# Patient Record
Sex: Female | Born: 1955 | Race: Black or African American | Hispanic: No | Smoking: Former smoker
Health system: Southern US, Community
[De-identification: ages and names within clinical notes are randomized; demographics above are authoritative.]

## PROBLEM LIST (undated history)

## (undated) DIAGNOSIS — E7800 Pure hypercholesterolemia, unspecified: Secondary | ICD-10-CM

---

## 2000-07-28 ENCOUNTER — Encounter: Payer: Self-pay | Admitting: Family Medicine

## 2000-07-28 ENCOUNTER — Encounter: Admission: RE | Admit: 2000-07-28 | Discharge: 2000-07-28 | Payer: Self-pay | Admitting: Family Medicine

## 2002-10-20 ENCOUNTER — Encounter (INDEPENDENT_AMBULATORY_CARE_PROVIDER_SITE_OTHER): Payer: Self-pay | Admitting: *Deleted

## 2002-10-20 ENCOUNTER — Ambulatory Visit (HOSPITAL_COMMUNITY): Admission: RE | Admit: 2002-10-20 | Discharge: 2002-10-20 | Payer: Self-pay | Admitting: *Deleted

## 2004-12-17 ENCOUNTER — Other Ambulatory Visit: Admission: RE | Admit: 2004-12-17 | Discharge: 2004-12-17 | Payer: Self-pay | Admitting: Family Medicine

## 2005-01-07 ENCOUNTER — Encounter: Admission: RE | Admit: 2005-01-07 | Discharge: 2005-01-07 | Payer: Self-pay | Admitting: Family Medicine

## 2008-10-12 ENCOUNTER — Other Ambulatory Visit: Admission: RE | Admit: 2008-10-12 | Discharge: 2008-10-12 | Payer: Self-pay | Admitting: Family Medicine

## 2008-10-19 ENCOUNTER — Encounter: Admission: RE | Admit: 2008-10-19 | Discharge: 2008-10-19 | Payer: Self-pay | Admitting: Family Medicine

## 2009-10-14 ENCOUNTER — Emergency Department (HOSPITAL_COMMUNITY): Admission: EM | Admit: 2009-10-14 | Discharge: 2009-10-14 | Payer: Self-pay | Admitting: Emergency Medicine

## 2010-07-24 ENCOUNTER — Other Ambulatory Visit: Admission: RE | Admit: 2010-07-24 | Discharge: 2010-07-24 | Payer: Self-pay | Admitting: Family Medicine

## 2011-01-12 ENCOUNTER — Encounter: Payer: Self-pay | Admitting: Family Medicine

## 2011-10-29 ENCOUNTER — Other Ambulatory Visit: Payer: Self-pay | Admitting: Family Medicine

## 2011-10-29 DIAGNOSIS — Z1231 Encounter for screening mammogram for malignant neoplasm of breast: Secondary | ICD-10-CM

## 2011-12-03 ENCOUNTER — Ambulatory Visit
Admission: RE | Admit: 2011-12-03 | Discharge: 2011-12-03 | Disposition: A | Discharge status: Home / Self Care | Payer: BC Managed Care – PPO | Source: Ambulatory Visit | Attending: Family Medicine | Admitting: Family Medicine

## 2011-12-03 DIAGNOSIS — Z1231 Encounter for screening mammogram for malignant neoplasm of breast: Secondary | ICD-10-CM

## 2012-12-06 ENCOUNTER — Other Ambulatory Visit: Payer: Self-pay | Admitting: Family Medicine

## 2012-12-06 DIAGNOSIS — Z1231 Encounter for screening mammogram for malignant neoplasm of breast: Secondary | ICD-10-CM

## 2013-01-05 ENCOUNTER — Ambulatory Visit
Admission: RE | Admit: 2013-01-05 | Discharge: 2013-01-05 | Disposition: A | Discharge status: Home / Self Care | Payer: Managed Care, Other (non HMO) | Source: Ambulatory Visit | Attending: Family Medicine | Admitting: Family Medicine

## 2013-01-05 DIAGNOSIS — Z1231 Encounter for screening mammogram for malignant neoplasm of breast: Secondary | ICD-10-CM

## 2013-04-07 ENCOUNTER — Other Ambulatory Visit: Payer: Self-pay | Admitting: Family Medicine

## 2013-04-07 DIAGNOSIS — R1013 Epigastric pain: Secondary | ICD-10-CM

## 2013-04-12 ENCOUNTER — Ambulatory Visit
Admission: RE | Admit: 2013-04-12 | Discharge: 2013-04-12 | Disposition: A | Discharge status: Home / Self Care | Payer: Managed Care, Other (non HMO) | Source: Ambulatory Visit | Attending: Family Medicine | Admitting: Family Medicine

## 2013-04-12 DIAGNOSIS — R1013 Epigastric pain: Secondary | ICD-10-CM

## 2013-12-08 ENCOUNTER — Other Ambulatory Visit (HOSPITAL_COMMUNITY)
Admission: RE | Admit: 2013-12-08 | Discharge: 2013-12-08 | Disposition: A | Discharge status: Home / Self Care | Payer: Managed Care, Other (non HMO) | Source: Ambulatory Visit | Attending: Family Medicine | Admitting: Family Medicine

## 2013-12-08 ENCOUNTER — Other Ambulatory Visit: Payer: Self-pay | Admitting: Family Medicine

## 2013-12-08 DIAGNOSIS — Z124 Encounter for screening for malignant neoplasm of cervix: Secondary | ICD-10-CM | POA: Insufficient documentation

## 2014-01-23 ENCOUNTER — Emergency Department (HOSPITAL_COMMUNITY)
Admission: EM | Admit: 2014-01-23 | Discharge: 2014-01-23 | Disposition: A | Discharge status: Home / Self Care | Payer: BC Managed Care – PPO | Attending: Emergency Medicine | Admitting: Emergency Medicine

## 2014-01-23 ENCOUNTER — Emergency Department (HOSPITAL_COMMUNITY): Payer: BC Managed Care – PPO

## 2014-01-23 ENCOUNTER — Encounter (HOSPITAL_COMMUNITY): Payer: Self-pay | Admitting: Emergency Medicine

## 2014-01-23 DIAGNOSIS — F17200 Nicotine dependence, unspecified, uncomplicated: Secondary | ICD-10-CM | POA: Insufficient documentation

## 2014-01-23 DIAGNOSIS — Z79899 Other long term (current) drug therapy: Secondary | ICD-10-CM | POA: Insufficient documentation

## 2014-01-23 DIAGNOSIS — R112 Nausea with vomiting, unspecified: Secondary | ICD-10-CM

## 2014-01-23 DIAGNOSIS — R1011 Right upper quadrant pain: Secondary | ICD-10-CM

## 2014-01-23 DIAGNOSIS — Z791 Long term (current) use of non-steroidal anti-inflammatories (NSAID): Secondary | ICD-10-CM | POA: Insufficient documentation

## 2014-01-23 DIAGNOSIS — E7800 Pure hypercholesterolemia, unspecified: Secondary | ICD-10-CM | POA: Insufficient documentation

## 2014-01-23 DIAGNOSIS — Z3202 Encounter for pregnancy test, result negative: Secondary | ICD-10-CM | POA: Insufficient documentation

## 2014-01-23 DIAGNOSIS — R52 Pain, unspecified: Secondary | ICD-10-CM | POA: Insufficient documentation

## 2014-01-23 HISTORY — DX: Pure hypercholesterolemia, unspecified: E78.00

## 2014-01-23 LAB — CBC WITH DIFFERENTIAL/PLATELET
BASOS ABS: 0.1 10*3/uL (ref 0.0–0.1)
Basophils Relative: 2 % — ABNORMAL HIGH (ref 0–1)
Eosinophils Absolute: 0.2 10*3/uL (ref 0.0–0.7)
Eosinophils Relative: 5 % (ref 0–5)
HEMATOCRIT: 36.1 % (ref 36.0–46.0)
HEMOGLOBIN: 12.8 g/dL (ref 12.0–15.0)
LYMPHS PCT: 38 % (ref 12–46)
Lymphs Abs: 1.8 10*3/uL (ref 0.7–4.0)
MCH: 33.5 pg (ref 26.0–34.0)
MCHC: 35.5 g/dL (ref 30.0–36.0)
MCV: 94.5 fL (ref 78.0–100.0)
MONO ABS: 0.4 10*3/uL (ref 0.1–1.0)
MONOS PCT: 8 % (ref 3–12)
NEUTROS ABS: 2.2 10*3/uL (ref 1.7–7.7)
NEUTROS PCT: 48 % (ref 43–77)
Platelets: 185 10*3/uL (ref 150–400)
RBC: 3.82 MIL/uL — ABNORMAL LOW (ref 3.87–5.11)
RDW: 12.1 % (ref 11.5–15.5)
WBC: 4.6 10*3/uL (ref 4.0–10.5)

## 2014-01-23 LAB — POCT PREGNANCY, URINE: Preg Test, Ur: NEGATIVE

## 2014-01-23 LAB — URINALYSIS, ROUTINE W REFLEX MICROSCOPIC
GLUCOSE, UA: NEGATIVE mg/dL
Hgb urine dipstick: NEGATIVE
KETONES UR: NEGATIVE mg/dL
Nitrite: NEGATIVE
PH: 5.5 (ref 5.0–8.0)
PROTEIN: NEGATIVE mg/dL
Specific Gravity, Urine: 1.02 (ref 1.005–1.030)
Urobilinogen, UA: 0.2 mg/dL (ref 0.0–1.0)

## 2014-01-23 LAB — COMPREHENSIVE METABOLIC PANEL
ALBUMIN: 3.9 g/dL (ref 3.5–5.2)
ALK PHOS: 63 U/L (ref 39–117)
ALT: 39 U/L — ABNORMAL HIGH (ref 0–35)
AST: 40 U/L — AB (ref 0–37)
BILIRUBIN TOTAL: 1 mg/dL (ref 0.3–1.2)
BUN: 6 mg/dL (ref 6–23)
CHLORIDE: 102 meq/L (ref 96–112)
CO2: 24 meq/L (ref 19–32)
CREATININE: 0.6 mg/dL (ref 0.50–1.10)
Calcium: 9.2 mg/dL (ref 8.4–10.5)
GFR calc Af Amer: >90 mL/min (ref >90)
Glucose, Bld: 108 mg/dL — ABNORMAL HIGH (ref 70–99)
POTASSIUM: 3.6 meq/L — AB (ref 3.7–5.3)
Sodium: 141 mEq/L (ref 137–147)
Total Protein: 7.1 g/dL (ref 6.0–8.3)

## 2014-01-23 LAB — URINE MICROSCOPIC-ADD ON

## 2014-01-23 LAB — LIPASE, BLOOD: Lipase: 130 U/L — ABNORMAL HIGH (ref 11–59)

## 2014-01-23 MED ORDER — SODIUM CHLORIDE 0.9 % IV BOLUS (SEPSIS)
1000.0000 mL | Freq: Once | INTRAVENOUS | Status: AC
  Administered 2014-01-23: 1000 mL via INTRAVENOUS

## 2014-01-23 MED ORDER — MORPHINE SULFATE 4 MG/ML IJ SOLN: 4.0000 mg | Freq: Once | INTRAMUSCULAR | Status: DC

## 2014-01-23 MED ORDER — MORPHINE SULFATE 4 MG/ML IJ SOLN
6.0000 mg | Freq: Once | INTRAMUSCULAR | Status: AC
  Administered 2014-01-23: 6 mg via INTRAVENOUS
  Filled 2014-01-23: qty 2

## 2014-01-23 NOTE — Discharge Instructions (Signed)
Take pain medication for any breakthrough pain. Take Phenergan for nausea. Avoid fatty foods. Low fat diet attached. Follow up with primary care doctor in 2-3 days for reevaluation. Recommend further evaluation of gallbladder with HIDA scan. Return to ED should your symptoms worsen, you develop intractable nausea/vomiting/diarrhea, you abdominal pain worsens, or you develop fever/chills.

## 2014-01-23 NOTE — Progress Notes (Signed)
   CARE MANAGEMENT ED NOTE 01/23/2014  Patient:  Mackenzie Dudley,Mackenzie Dudley   Account Number:  [id number]  Date Initiated:  01/23/2014  Documentation initiated by:  Edd ArbourGIBBS,Ramina Hulet  Subjective/Objective Assessment:   58 yr old [facility] [place]cp listed Pt confirms pcp as [employer]  c/o abdominal pain, flank pain     Subjective/Objective Assessment Detail:     Action/Plan:   EPIC updated   Action/Plan Detail:   Anticipated DC Date:       Status Recommendation to Physician:   Result of Recommendation:    Other ED Services  Consult Working Plan    DC Planning Services  Outpatient Services - Pt will follow up  Other  PCP issues    Choice offered to / List presented to:            Status of service:  Completed, signed off  ED Comments:   ED Comments Detail:

## 2014-01-23 NOTE — ED Notes (Signed)
Pt reports abdominal and right sided flank pain 10/10 x2 week. Reports vomited 2 days ago, nausea and diarrhea yesterday. At present denies nausea. Denies dysuria.

## 2014-01-23 NOTE — ED Notes (Signed)
US at bedside

## 2014-01-23 NOTE — ED Provider Notes (Signed)
CSN: [id number]     Arrival date & time 01/23/14  [id number] History   First MD Initiated Contact with Patient 01/23/14 [phone]     Chief Complaint  Patient presents with  . Abdominal Pain  . Flank Pain   (Consider location/radiation/quality/duration/timing/severity/associated sxs/prior Treatment) Patient is a 58 y.o. female presenting with abdominal pain and flank pain.  Abdominal Pain Associated symptoms: no hematuria   Flank Pain Associated symptoms include abdominal pain.   58 yo female presents with right sided abdominal pain and flank pain that started about 2 weeks ago. Patient states pain is 10/10 and described as "it's painful". Patient admits to intermittent N/V/D. Patient states symptoms seem to be worsened after eating. Patient denies any urinary symptoms. Denies fever/chills. Patient is a 20+ pack year smoker. PMH significant for high cholesterol. Family hx significant for colon cancer. Patient gets regular screening colonoscopies, though is due for one this year.  Past Medical History  Diagnosis Date  . High cholesterol    History reviewed. No pertinent past surgical history. History reviewed. No pertinent family history. History  Substance Use Topics  . Smoking status: Light Tobacco Smoker    Types: Cigarettes  . Smokeless tobacco: Not on file  . Alcohol Use: Yes     Comment: 1 beer/ day   OB History   Grav Para Term Preterm Abortions TAB SAB Ect Mult Living                 Review of Systems  Gastrointestinal: Positive for abdominal pain. Negative for blood in stool.  Genitourinary: Positive for flank pain. Negative for hematuria.  All other systems reviewed and are negative.    Allergies  Review of patient's allergies indicates no known allergies.  Home Medications   Current Outpatient Rx  Name  Route  Sig  Dispense  Refill  . acetaminophen (TYLENOL) 500 MG tablet   Oral   Take 500 mg by mouth every 6 (six) hours as needed.         . bisacodyl (DULCOLAX)  5 MG EC tablet   Oral   Take 5 mg by mouth daily as needed for moderate constipation.         Marland Kitchen. HYDROcodone-acetaminophen (NORCO/VICODIN) 5-325 MG per tablet   Oral   Take 1 tablet by mouth every 4 (four) hours as needed.         . naproxen sodium (ANAPROX) 220 MG tablet   Oral   Take 220 mg by mouth 2 (two) times daily with a meal.         . pravastatin (PRAVACHOL) 40 MG tablet   Oral   Take 1 tablet by mouth daily.         . promethazine (PHENERGAN) 25 MG tablet   Oral   Take 25 mg by mouth every 6 (six) hours as needed for nausea or vomiting.         . simethicone (MYLICON) 80 MG chewable tablet   Oral   Chew 80 mg by mouth every 6 (six) hours as needed for flatulence.         . tamsulosin (FLOMAX) 0.4 MG CAPS capsule   Oral   Take 1 capsule by mouth daily.         . traMADol (ULTRAM) 50 MG tablet   Oral   Take 50 mg by mouth every 6 (six) hours as needed.         . traZODone (DESYREL) 50 MG tablet   Oral  Take 50-150 mg by mouth at bedtime.          BP 175/96  Pulse 80  Temp(Src) 97.5 F (36.4 C) (Oral)  Resp 16  Ht 5\' 1"  (1.549 m)  Wt 164 lb (74.39 kg)  BMI 31.00 kg/m2  SpO2 100% Physical Exam  Nursing note and vitals reviewed. Constitutional: She is oriented to person, place, and time. She appears well-developed and well-nourished. No distress.  HENT:  Head: Normocephalic and atraumatic.  Eyes: Conjunctivae and EOM are normal. No scleral icterus.  Neck: Normal range of motion. Neck supple. No JVD present.  Cardiovascular: Normal rate and regular rhythm.  Exam reveals no gallop and no friction rub.   No murmur heard. Pulmonary/Chest: Effort normal and breath sounds normal. No respiratory distress. She has no wheezes. She has no rales.  Abdominal: Soft. Bowel sounds are normal. There is no hepatosplenomegaly. There is tenderness in the right upper quadrant. There is no rigidity, no rebound, no guarding, no tenderness at McBurney's point  and negative Murphy's sign.  Musculoskeletal: Normal range of motion. She exhibits no edema.  Neurological: She is alert and oriented to person, place, and time.  Skin: Skin is warm and dry. She is not diaphoretic.  Psychiatric: She has a normal mood and affect. Her behavior is normal.    ED Course  Procedures (including critical care time) Labs Review Labs Reviewed  CBC WITH DIFFERENTIAL - Abnormal; Notable for the following:    RBC 3.82 (*)    Basophils Relative 2 (*)    All other components within normal limits  COMPREHENSIVE METABOLIC PANEL - Abnormal; Notable for the following:    Potassium 3.6 (*)    Glucose, Bld 108 (*)    AST 40 (*)    ALT 39 (*)    All other components within normal limits  LIPASE, BLOOD - Abnormal; Notable for the following:    Lipase 130 (*)    All other components within normal limits  URINALYSIS, ROUTINE W REFLEX MICROSCOPIC - Abnormal; Notable for the following:    Color, Urine AMBER (*)    APPearance CLOUDY (*)    Bilirubin Urine SMALL (*)    Leukocytes, UA SMALL (*)    All other components within normal limits  URINE MICROSCOPIC-ADD ON - Abnormal; Notable for the following:    Bacteria, UA FEW (*)    All other components within normal limits  POCT PREGNANCY, URINE   Imaging Review US Abdomen Complete  01/23/2014   CLINICAL DATA:  Right upper quadrant abdominal pain.  EXAM: ULTRASOUND ABDOMEN COMPLETE  COMPARISON:  US ABDOMEN COMPLETE dated 04/12/2013  FINDINGS: Gallbladder:  No gallstones or wall thickening visualized. No sonographic Murphy sign noted.  Common bile duct:  Diameter: 7.5 mm. This appears slightly more dilated than previously, although tapers distally. There is no evidence of choledocholithiasis.  Liver:  No focal lesion identified. Within normal limits in parenchymal echogenicity.  IVC:  No abnormality visualized.  Pancreas:  No abnormality identified. Portions of the pancreatic tail are obscured by bowel gas.  Spleen:  Size and  appearance within normal limits.  Right Kidney:  Length: 11.1 cm. Echogenicity within normal limits. No mass or hydronephrosis visualized.  Left Kidney:  Length: 10.3 cm. Echogenicity within normal limits. No mass or hydronephrosis visualized.  Abdominal aorta:  No aneurysm visualized.  Other findings:  None.  IMPRESSION: 1. New mild fusiform dilatation of the common bile duct. No pancreatic mass or choledocholithiasis identified. Correlation with liver function studies  recommended. 2. Otherwise unremarkable study.  The gallbladder appears normal.   Electronically Signed   By: Roxy Horseman M.D.   On: 01/23/2014 12:35    EKG Interpretation   None       MDM   1. Abdominal pain, acute, right upper quadrant   2. N&V (nausea and vomiting)    Patient afebrile.  BP elevated, patient currently asymptomatic. Recommend follow up with PCP in 2 days for recheck.  Urine preg negative UA shows small bilirubin, leukocytes and few bacteria.  Mild transaminitis with mildy elevated Lipase at 130 Abdominal US shows New mild dilatation of common bile duct. No pancreatic mass or choledocholithiasis. Otherwise unremarkable study.   Plan to treat patient's symptoms and have patient follow up with PCP in 2 days for further evaluation. Recommend HIDA scan on outpatient basis. Patient currently has plenty of nausea and pain medication from previous doctor visit for same symptoms. Patient given return precautions if sxs should worsen. Recommend avoiding fatty foods. Low fat diet handout provided. Patient agrees with plan. Discharged in good condition.  Meds given in ED:  Medications  sodium chloride 0.9 % bolus 1,000 mL (0 mLs Intravenous Stopped 01/23/14 1218)  morphine 4 MG/ML injection 6 mg (6 mg Intravenous Given 01/23/14 1122)    Discharge Medication List as of 01/23/2014  1:21 PM          Rudene Anda, PA-C 01/24/14 2221

## 2014-01-26 NOTE — ED Provider Notes (Signed)
Medical screening examination/treatment/procedure(s) were performed by non-physician practitioner and as supervising physician I was immediately available for consultation/collaboration.  EKG Interpretation   None        Flint MelterElliott L Selisa Tensley, MD 01/26/14 1141

## 2015-06-06 ENCOUNTER — Other Ambulatory Visit: Payer: Self-pay

## 2015-06-06 DIAGNOSIS — Z1231 Encounter for screening mammogram for malignant neoplasm of breast: Secondary | ICD-10-CM

## 2015-06-11 ENCOUNTER — Ambulatory Visit
Admission: RE | Admit: 2015-06-11 | Discharge: 2015-06-11 | Disposition: A | Discharge status: Home / Self Care | Payer: BLUE CROSS/BLUE SHIELD | Source: Ambulatory Visit

## 2015-06-11 DIAGNOSIS — Z1231 Encounter for screening mammogram for malignant neoplasm of breast: Secondary | ICD-10-CM

## 2016-08-27 ENCOUNTER — Other Ambulatory Visit: Payer: Self-pay | Admitting: Family Medicine

## 2016-08-27 ENCOUNTER — Other Ambulatory Visit (HOSPITAL_COMMUNITY)
Admission: RE | Admit: 2016-08-27 | Discharge: 2016-08-27 | Disposition: A | Discharge status: Home / Self Care | Payer: BLUE CROSS/BLUE SHIELD | Source: Ambulatory Visit | Attending: Family Medicine | Admitting: Family Medicine

## 2016-08-27 DIAGNOSIS — Z01419 Encounter for gynecological examination (general) (routine) without abnormal findings: Secondary | ICD-10-CM | POA: Diagnosis not present

## 2016-08-28 ENCOUNTER — Other Ambulatory Visit: Payer: Self-pay | Admitting: Family Medicine

## 2016-08-28 DIAGNOSIS — Z1231 Encounter for screening mammogram for malignant neoplasm of breast: Secondary | ICD-10-CM

## 2016-08-29 LAB — CYTOLOGY - PAP

## 2016-09-10 ENCOUNTER — Ambulatory Visit
Admission: RE | Admit: 2016-09-10 | Discharge: 2016-09-10 | Disposition: A | Discharge status: Home / Self Care | Payer: BLUE CROSS/BLUE SHIELD | Source: Ambulatory Visit | Attending: Family Medicine | Admitting: Family Medicine

## 2016-09-10 DIAGNOSIS — Z1231 Encounter for screening mammogram for malignant neoplasm of breast: Secondary | ICD-10-CM

## 2017-09-21 ENCOUNTER — Ambulatory Visit (INDEPENDENT_AMBULATORY_CARE_PROVIDER_SITE_OTHER): Payer: Self-pay | Admitting: Orthopaedic Surgery

## 2017-09-30 ENCOUNTER — Other Ambulatory Visit: Payer: Self-pay | Admitting: Family Medicine

## 2017-09-30 DIAGNOSIS — Z1231 Encounter for screening mammogram for malignant neoplasm of breast: Secondary | ICD-10-CM

## 2017-10-07 ENCOUNTER — Ambulatory Visit
Admission: RE | Admit: 2017-10-07 | Discharge: 2017-10-07 | Disposition: A | Discharge status: Home / Self Care | Payer: BLUE CROSS/BLUE SHIELD | Source: Ambulatory Visit | Attending: Family Medicine | Admitting: Family Medicine

## 2017-10-07 DIAGNOSIS — Z1231 Encounter for screening mammogram for malignant neoplasm of breast: Secondary | ICD-10-CM

## 2017-10-08 ENCOUNTER — Ambulatory Visit: Payer: BLUE CROSS/BLUE SHIELD

## 2018-09-22 ENCOUNTER — Encounter (INDEPENDENT_AMBULATORY_CARE_PROVIDER_SITE_OTHER): Payer: Self-pay | Admitting: Orthopaedic Surgery

## 2018-09-22 ENCOUNTER — Ambulatory Visit (INDEPENDENT_AMBULATORY_CARE_PROVIDER_SITE_OTHER): Payer: Self-pay

## 2018-09-22 ENCOUNTER — Ambulatory Visit (INDEPENDENT_AMBULATORY_CARE_PROVIDER_SITE_OTHER): Payer: BLUE CROSS/BLUE SHIELD | Admitting: Orthopaedic Surgery

## 2018-09-22 DIAGNOSIS — B070 Plantar wart: Secondary | ICD-10-CM

## 2018-09-22 DIAGNOSIS — M25511 Pain in right shoulder: Secondary | ICD-10-CM | POA: Diagnosis not present

## 2018-09-22 DIAGNOSIS — G8929 Other chronic pain: Secondary | ICD-10-CM

## 2018-09-22 NOTE — Progress Notes (Signed)
Office Visit Note   Patient: Mackenzie Dudley           Date of Birth: Jul 23, 1956           MRN: [id number] Visit Date: 09/22/2018              Requested by: Blair Heys, MD [address] [place], [place] [address] PCP: Blair Heys, MD   Assessment & Plan: Visit Diagnoses:  1. Chronic right shoulder pain   2. Plantar wart of left foot     Plan: We will have her work on range of motion of the right shoulder with forward flexion exercises pendulum exercises.  In regards to the plantars wart she is advised to get some Compound W apply after paring down the callus.  See her back in 2 weeks to check her response to the injection in the right shoulder.  If the wart on the bottom of her foot just not resolve with the Compound W recommend she see a dermatologist.  Questions encouraged and answered by Dr. Magnus Ivan myself.  Follow-Up Instructions: Return in about 2 weeks (around 10/06/2018).   Orders:  Orders Placed This Encounter  Procedures  . XR Shoulder Right   No orders of the defined types were placed in this encounter.     Procedures: No procedures performed   Clinical Data: No additional findings.   Subjective: Chief Complaint  Patient presents with  . Right Shoulder - Pain    HPI Mackenzie Dudley 62 year old female who we not seen in the past four years.  She comes in today with right shoulder pain that is been ongoing for the past 3 months.  No particular injury.  She has a remote history of a right shoulder dislocation 2005.  She has had no additional dislocations.  She was given a cortisone injection of the right shoulder in March 2015 did well with this until just recently.  States she is having constant pain in the shoulder.  States it feels as if there is pulling sensation in the shoulder goes down to the elbow.  She denies any neck pain she has occasional tingling down the right arm to the wrist but this is rare.  She is tried Aspercreme and ibuprofen  which helps with this short-lived.  Pain in the shoulder does awaken him.  She also has an area on her foot that is bothering her she is seeing a podiatrist for his pared this area down.  Area the callus continues to form despite her paring this down at home. Review of Systems Please see HPI otherwise noncontributory or negative.  Objective: Vital Signs: There were no vitals taken for this visit.  Physical Exam  Constitutional: She is oriented to person, place, and time. She appears well-developed and well-nourished. No distress.  Pulmonary/Chest: Effort normal.  Neurological: She is alert and oriented to person, place, and time.  Skin: She is not diaphoretic.  Psychiatric: She has a normal mood and affect.    Ortho Exam Left foot callus formation under the fourth metatarsal midshaft region with a plantar wart in the midportion of this.  No other rash skin lesions ulcerations in the foot.  Nontender over the metatarsal heads with palpation. Right shoulder: He has 5 out of 5 strength with external and internal rotation against resistance.  Empty can test 5 out of 5 strength bilaterally in the painful on the right.  Positive impingement on the right negative on the left.  Fluid motion  of the right shoulder without significant pain.  Liftoff test she has 5 out of 5 strength bilaterally but again painful on the right. Cervical spine full forward flexion extension negative Spurling's.  Tenderness right medial border of the scapula. Specialty Comments:  No specialty comments available.  Imaging: Xr Shoulder Right  Result Date: 09/22/2018 Right shoulder 3 views: No acute fracture.  Shoulders well located.  Osteophytes off the most lateral aspect of the acromium.  Subacromial space well maintained.  Glenohumeral joint is well-maintained    PMFS History: There are no active problems to display for this patient.  Past Medical History:  Diagnosis Date  . High cholesterol     History  reviewed. No pertinent family history.  History reviewed. No pertinent surgical history. Social History   Occupational History  . Not on file  Tobacco Use  . Smoking status: Light Tobacco Smoker    Types: Cigarettes  . Smokeless tobacco: Never Used  Substance and Sexual Activity  . Alcohol use: Yes    Comment: 1 beer/ day  . Drug use: No  . Sexual activity: Not on file

## 2018-10-06 ENCOUNTER — Ambulatory Visit (INDEPENDENT_AMBULATORY_CARE_PROVIDER_SITE_OTHER): Payer: BLUE CROSS/BLUE SHIELD | Admitting: Orthopaedic Surgery

## 2018-11-25 ENCOUNTER — Other Ambulatory Visit: Payer: Self-pay | Admitting: Family Medicine

## 2018-11-25 DIAGNOSIS — Z1231 Encounter for screening mammogram for malignant neoplasm of breast: Secondary | ICD-10-CM

## 2019-01-05 ENCOUNTER — Ambulatory Visit
Admission: RE | Admit: 2019-01-05 | Discharge: 2019-01-05 | Disposition: A | Discharge status: Home / Self Care | Payer: BLUE CROSS/BLUE SHIELD | Source: Ambulatory Visit | Attending: Family Medicine | Admitting: Family Medicine

## 2019-01-05 DIAGNOSIS — Z1231 Encounter for screening mammogram for malignant neoplasm of breast: Secondary | ICD-10-CM

## 2019-01-06 ENCOUNTER — Other Ambulatory Visit: Payer: Self-pay | Admitting: Family Medicine

## 2019-01-06 DIAGNOSIS — R928 Other abnormal and inconclusive findings on diagnostic imaging of breast: Secondary | ICD-10-CM

## 2019-01-10 ENCOUNTER — Ambulatory Visit
Admission: RE | Admit: 2019-01-10 | Discharge: 2019-01-10 | Disposition: A | Discharge status: Home / Self Care | Payer: BLUE CROSS/BLUE SHIELD | Source: Ambulatory Visit | Attending: Family Medicine | Admitting: Family Medicine

## 2019-01-10 DIAGNOSIS — R928 Other abnormal and inconclusive findings on diagnostic imaging of breast: Secondary | ICD-10-CM

## 2020-02-16 ENCOUNTER — Other Ambulatory Visit: Payer: Self-pay | Admitting: Obstetrics and Gynecology

## 2020-02-16 ENCOUNTER — Other Ambulatory Visit: Payer: Self-pay | Admitting: Family Medicine

## 2020-02-16 DIAGNOSIS — Z1231 Encounter for screening mammogram for malignant neoplasm of breast: Secondary | ICD-10-CM

## 2020-02-17 ENCOUNTER — Other Ambulatory Visit: Payer: Self-pay

## 2020-02-17 ENCOUNTER — Ambulatory Visit
Admission: RE | Admit: 2020-02-17 | Discharge: 2020-02-17 | Disposition: A | Discharge status: Home / Self Care | Payer: BC Managed Care – PPO | Source: Ambulatory Visit | Attending: Family Medicine | Admitting: Family Medicine

## 2020-02-17 DIAGNOSIS — Z1231 Encounter for screening mammogram for malignant neoplasm of breast: Secondary | ICD-10-CM

## 2020-09-19 ENCOUNTER — Encounter: Payer: Self-pay | Admitting: Orthopaedic Surgery

## 2020-09-19 ENCOUNTER — Ambulatory Visit (INDEPENDENT_AMBULATORY_CARE_PROVIDER_SITE_OTHER): Payer: 59 | Admitting: Orthopaedic Surgery

## 2020-09-19 DIAGNOSIS — G8929 Other chronic pain: Secondary | ICD-10-CM | POA: Diagnosis not present

## 2020-09-19 DIAGNOSIS — M25511 Pain in right shoulder: Secondary | ICD-10-CM | POA: Diagnosis not present

## 2020-09-19 MED ORDER — METHYLPREDNISOLONE ACETATE 40 MG/ML IJ SUSP
40.0000 mg | INTRAMUSCULAR | Status: AC | PRN
  Administered 2020-09-19: 40 mg via INTRA_ARTICULAR

## 2020-09-19 MED ORDER — NABUMETONE 500 MG PO TABS: 500.0000 mg | ORAL_TABLET | Freq: Two times a day (BID) | ORAL | 1 refills | Status: DC | PRN

## 2020-09-19 MED ORDER — LIDOCAINE HCL 1 % IJ SOLN
3.0000 mL | INTRAMUSCULAR | Status: AC | PRN
  Administered 2020-09-19: 3 mL

## 2020-09-19 MED ORDER — METHYLPREDNISOLONE 4 MG PO TABS: ORAL_TABLET | ORAL | 0 refills | Status: DC

## 2020-09-19 NOTE — Progress Notes (Signed)
Office Visit Note   Patient: Mackenzie Dudley           Date of Birth: 1956/09/23           MRN: [id number] Visit Date: 09/19/2020              Requested by: Blair Heys, MD [address] [place],  [place] [address] PCP: Blair Heys, MD   Assessment & Plan: Visit Diagnoses:  1. Chronic right shoulder pain     Plan: Certainly her muscle aches and pains and body cramps could be related to something metabolic.  She does state that her primary care physician drew a bunch of labs on her but did not report anything out of the ordinary.  She is not a diabetic.  I am not sure what her labs show and she may get those sent to my office.  I want her started on Relafen as an anti-inflammatory and try a steroid taper as well as provide a steroid injection right shoulder today since she is getting ready to start her job.  She said that every time she is seen me she does feel better afterwards.  She agrees with this treatment plan.  She did tolerate the steroid injection well.  I would like to see her back in 4 weeks to make sure she is doing well.  All question concerns were answered and addressed  Follow-Up Instructions: Return in about 4 weeks (around 10/17/2020).   Orders:  Orders Placed This Encounter  Procedures  . Large Joint Inj   Meds ordered this encounter  Medications  . methylPREDNISolone (MEDROL) 4 MG tablet    Sig: Medrol dose pack. Take as instructed    Dispense:  21 tablet    Refill:  0  . nabumetone (RELAFEN) 500 MG tablet    Sig: Take 1 tablet (500 mg total) by mouth 2 (two) times daily as needed.    Dispense:  60 tablet    Refill:  1      Procedures: Large Joint Inj: R subacromial bursa on 09/19/2020 11:09 AM Indications: pain and diagnostic evaluation Details: 22 G 1.5 in needle  Arthrogram: No  Medications: 3 mL lidocaine 1 %; 40 mg methylPREDNISolone acetate 40 MG/ML Outcome: tolerated well, no immediate complications Procedure, treatment  alternatives, risks and benefits explained, specific risks discussed. Consent was given by the patient. Immediately prior to procedure a time out was called to verify the correct patient, procedure, equipment, support staff and site/side marked as required. Patient was prepped and draped in the usual sterile fashion.       Clinical Data: No additional findings.   Subjective: Chief Complaint  Patient presents with  . 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  She comes in with right arm pain but also just all over body cramps and aches which she states occurs all the time.  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  She says that is not involved with her activities.  She still been dealing with some right shoulder pain and was seen her for her shoulder before finding significant tendinitis in the shoulder.  She is now diabetic.  Currently she is not taking any anti-inflammatories.  She is on a cholesterol medication but she has been on this for a long period of time.  She does not have any specific  joint complaints but does feel like the right arm does get weak for her.  She is right-hand dominant.  HPI  Review of Systems She currently denies any headache, chest pain, shortness of breath, fever, chills, nausea, vomiting  Objective: Vital Signs: There were no vitals taken for this visit.  Physical Exam She is alert and orient x3 and in no acute distress Ortho Exam Examination of her right upper extremity does show some impingement of the right shoulder but otherwise normal exam.  She has good grip and pinch strength bilaterally with no atrophy in the upper or lower extremity muscle groups and no cramping today. Specialty Comments:  No specialty comments available.  Imaging: No results found.   PMFS History: There are no problems to display for this patient.  Past Medical History:    Diagnosis Date  . High cholesterol     No family history on file.  No past surgical history on file. Social History   Occupational History  . Not on file  Tobacco Use  . Smoking status: Light Tobacco Smoker    Types: Cigarettes  . Smokeless tobacco: Never Used  Substance and Sexual Activity  . Alcohol use: Yes    Comment: 1 beer/ day  . Drug use: No  . Sexual activity: Not on file

## 2020-09-25 ENCOUNTER — Telehealth: Payer: Self-pay | Admitting: Orthopaedic Surgery

## 2020-09-25 NOTE — Telephone Encounter (Signed)
Patient states she can't remember her question but if she does she will call back She states she is doing well after injection

## 2020-09-25 NOTE — Telephone Encounter (Signed)
Pt states she has a question for Morrie Sheldon and would like a CB   [phone]

## 2020-10-17 ENCOUNTER — Ambulatory Visit: Payer: 59 | Admitting: Orthopaedic Surgery

## 2020-11-09 ENCOUNTER — Other Ambulatory Visit: Payer: Self-pay | Admitting: Orthopaedic Surgery

## 2020-12-08 ENCOUNTER — Ambulatory Visit (INDEPENDENT_AMBULATORY_CARE_PROVIDER_SITE_OTHER): Payer: 59

## 2020-12-08 ENCOUNTER — Other Ambulatory Visit: Payer: Self-pay

## 2020-12-08 ENCOUNTER — Ambulatory Visit (HOSPITAL_COMMUNITY)
Admission: EM | Admit: 2020-12-08 | Discharge: 2020-12-08 | Disposition: A | Discharge status: Home / Self Care | Payer: 59 | Attending: Family Medicine | Admitting: Family Medicine

## 2020-12-08 ENCOUNTER — Encounter (HOSPITAL_COMMUNITY): Payer: Self-pay

## 2020-12-08 DIAGNOSIS — J069 Acute upper respiratory infection, unspecified: Secondary | ICD-10-CM | POA: Insufficient documentation

## 2020-12-08 DIAGNOSIS — R059 Cough, unspecified: Secondary | ICD-10-CM

## 2020-12-08 DIAGNOSIS — R062 Wheezing: Secondary | ICD-10-CM | POA: Diagnosis not present

## 2020-12-08 DIAGNOSIS — R0602 Shortness of breath: Secondary | ICD-10-CM

## 2020-12-08 DIAGNOSIS — U071 COVID-19: Secondary | ICD-10-CM | POA: Diagnosis not present

## 2020-12-08 NOTE — Discharge Instructions (Addendum)
You have been seen at the [facility] today for coughing and shortness of breath. Your evaluation today was not suggestive of any emergent condition requiring hospitalization at this time. Your chest x-ray showed evidence of viral pneumonia. Your ECG (heart tracing) did not show any worrisome changes. However, some medical problems make take more time to appear. Therefore, it's very important that you pay attention to any new symptoms or worsening of your current condition.  Please proceed directly to the Emergency Department immediately should you feel worse in any way or have any of the following symptoms: worsening shortness of breath, high fever, pain that spreads to your arm, neck, jaw, back or abdomen, or nausea and vomiting.   You have been tested for COVID-19 today. If your test returns positive, you will receive a phone call from [facility] regarding your results. Negative test results are not called. Both positive and negative results area always visible on MyChart. If you do not have a MyChart account, sign up instructions are provided in your discharge papers. Please do not hesitate to contact us should you have questions or concerns.

## 2020-12-08 NOTE — ED Triage Notes (Signed)
Pt in with c/o dry cough, sob, and chest pain that started Tuesday night. States that is she lays on her side the pain in her arm and chest feel really "sore"  Also c/o weakness and fatigue  Pt took mucinex with some relief  Denies any history of respiratory issues

## 2020-12-09 LAB — SARS CORONAVIRUS 2 (TAT 6-24 HRS): SARS Coronavirus 2: POSITIVE — AB

## 2020-12-10 ENCOUNTER — Telehealth: Payer: Self-pay | Admitting: Unknown Physician Specialty

## 2020-12-10 NOTE — ED Provider Notes (Signed)
  [facility]   [id number] 12/08/20 Arrival Time: 1240  ASSESSMENT & PLAN:  1. Cough   2. SOB (shortness of breath)     COVID-19 testing sent. See letter/work note on file for self-isolation guidelines. OTC symptom care as needed.  I have personally viewed the imaging studies ordered this visit. Slight patchy infiltrates at bases. Discussed with her. Makes me more suspicious for COVID. No indications for hospital evaluation.  OTC symptom care.  Reviewed expectations re: course of current medical issues. Questions answered. Outlined signs and symptoms indicating need for more acute intervention. Understanding verbalized. After Visit Summary given.   SUBJECTIVE: History from: patient. Mackenzie Dudley is a 64 y.o. female who presents with worries regarding COVID-19. Known COVID-19 contact: none. Recent travel: none. Reports: dry cough, chest "soreness", and occasional SOB with exertion. Also feeling fatigued. Several days. Denies: fever and headache. Normal PO intake without n/v/d.    OBJECTIVE:  Vitals:   12/08/20 1249 12/08/20 1257  BP: (!) 154/81   Pulse: 86   Resp: (!) 21   Temp:  98.7 F (37.1 C)  TempSrc:  Oral  SpO2: 99%     Recheck RR: 18  General appearance: alert; no distress Eyes: PERRLA; EOMI; conjunctiva normal HENT: [place]; AT; with nasal congestion Neck: supple  Lungs: speaks full sentences without difficulty; unlabored; CTAB without wheezing Extremities: no edema Skin: warm and dry Neurologic: normal gait Psychological: alert and cooperative; normal mood and affect   Imaging: DG Chest 2 View  Result Date: 12/08/2020 CLINICAL DATA:  Shortness of breath, wheezing, and productive cough for 1 week. EXAM: CHEST - 2 VIEW COMPARISON:  None. FINDINGS: No pneumothorax. The heart, hila, mediastinum are normal. Mild patchy opacities in the lung bases. No other acute abnormalities. IMPRESSION: Mild patchy opacities in the lung bases could represent  atelectasis or developing multifocal infiltrates. Recommend clinical correlation and attention on short-term follow-up. Electronically Signed   By: Gerome Sam III M.D   On: 12/08/2020 14:50    No Known Allergies  Past Medical History:  Diagnosis Date  . High cholesterol    Social History   Socioeconomic History  . Marital status: Widowed    Spouse name: Not on file  . Number of children: Not on file  . Years of education: Not on file  . Highest education level: Not on file  Occupational History  . Not on file  Tobacco Use  . Smoking status: Former Smoker    Types: Cigarettes  . Smokeless tobacco: Never Used  Substance and Sexual Activity  . Alcohol use: Yes    Comment: 1 beer/ day  . Drug use: No  . Sexual activity: Not on file  Other Topics Concern  . Not on file  Social History Narrative  . Not on file   Social Determinants of Health   Financial Resource Strain: Not on file  Food Insecurity: Not on file  Transportation Needs: Not on file  Physical Activity: Not on file  Stress: Not on file  Social Connections: Not on file  Intimate Partner Violence: Not on file   History reviewed. No pertinent family history. History reviewed. No pertinent surgical history.   Mardella Layman, MD 12/10/20 1055

## 2020-12-10 NOTE — Telephone Encounter (Signed)
I connected by phone with Mackenzie Dudley on 12/10/2020 at 4:11 PM to discuss the potential use of a new treatment for mild to moderate COVID-19 viral infection in non-hospitalized patients.  This patient is a 64 y.o. female that meets the FDA criteria for Emergency Use Authorization of COVID monoclonal antibody casirivimab/imdevimab, bamlanivimab/etesevimab, or sotrovimab.  Has a (+) direct SARS-CoV-2 viral test result  Has mild or moderate COVID-19   Is NOT hospitalized due to COVID-19  Is within 10 days of symptom onset  Has at least one of the high risk factor(s) for progression to severe COVID-19 and/or hospitalization as defined in EUA.  Specific high risk criteria : BMI > 25   I have spoken and communicated the following to the patient or parent/caregiver regarding COVID monoclonal antibody treatment:  1. FDA has authorized the emergency use for the treatment of mild to moderate COVID-19 in adults and pediatric patients with positive results of direct SARS-CoV-2 viral testing who are 55 years of age and older weighing at least 40 kg, and who are at high risk for progressing to severe COVID-19 and/or hospitalization.  2. The significant known and potential risks and benefits of COVID monoclonal antibody, and the extent to which such potential risks and benefits are unknown.  3. Information on available alternative treatments and the risks and benefits of those alternatives, including clinical trials.  4. Patients treated with COVID monoclonal antibody should continue to self-isolate and use infection control measures (e.g., wear mask, isolate, social distance, avoid sharing personal items, clean and disinfect high touch surfaces, and frequent handwashing) according to CDC guidelines.   5. The patient or parent/caregiver has the option to accept or refuse COVID monoclonal antibody treatment.  After reviewing this information with the patient, the patient has DECLINED offer to  receive the infusion. Gabriel Cirri, NP 12/10/2020 4:11 PM  Sx onset 12/14

## 2021-01-01 DIAGNOSIS — Z1152 Encounter for screening for COVID-19: Secondary | ICD-10-CM | POA: Diagnosis not present

## 2021-03-08 DIAGNOSIS — Z79899 Other long term (current) drug therapy: Secondary | ICD-10-CM | POA: Diagnosis not present

## 2021-03-08 DIAGNOSIS — E7800 Pure hypercholesterolemia, unspecified: Secondary | ICD-10-CM | POA: Diagnosis not present

## 2021-03-08 DIAGNOSIS — R009 Unspecified abnormalities of heart beat: Secondary | ICD-10-CM | POA: Diagnosis not present

## 2021-04-01 ENCOUNTER — Other Ambulatory Visit: Payer: Self-pay | Admitting: Family Medicine

## 2021-04-01 DIAGNOSIS — Z1231 Encounter for screening mammogram for malignant neoplasm of breast: Secondary | ICD-10-CM

## 2021-05-22 ENCOUNTER — Ambulatory Visit
Admission: RE | Admit: 2021-05-22 | Discharge: 2021-05-22 | Disposition: A | Discharge status: Home / Self Care | Payer: 59 | Source: Ambulatory Visit | Attending: Family Medicine | Admitting: Family Medicine

## 2021-05-22 ENCOUNTER — Other Ambulatory Visit: Payer: Self-pay

## 2021-05-22 DIAGNOSIS — Z1231 Encounter for screening mammogram for malignant neoplasm of breast: Secondary | ICD-10-CM

## 2021-07-02 DIAGNOSIS — B070 Plantar wart: Secondary | ICD-10-CM | POA: Diagnosis not present

## 2021-07-02 DIAGNOSIS — M545 Low back pain, unspecified: Secondary | ICD-10-CM | POA: Diagnosis not present

## 2021-07-29 DIAGNOSIS — S0500XA Injury of conjunctiva and corneal abrasion without foreign body, unspecified eye, initial encounter: Secondary | ICD-10-CM | POA: Diagnosis not present

## 2021-08-01 ENCOUNTER — Ambulatory Visit (INDEPENDENT_AMBULATORY_CARE_PROVIDER_SITE_OTHER): Payer: Medicare HMO

## 2021-08-01 ENCOUNTER — Ambulatory Visit: Payer: Medicare HMO | Admitting: Orthopaedic Surgery

## 2021-08-01 DIAGNOSIS — G8929 Other chronic pain: Secondary | ICD-10-CM

## 2021-08-01 DIAGNOSIS — M5441 Lumbago with sciatica, right side: Secondary | ICD-10-CM | POA: Diagnosis not present

## 2021-08-01 MED ORDER — TIZANIDINE HCL 4 MG PO TABS: 4.0000 mg | ORAL_TABLET | Freq: Three times a day (TID) | ORAL | 0 refills | Status: AC | PRN

## 2021-08-01 MED ORDER — METHYLPREDNISOLONE 4 MG PO TABS: ORAL_TABLET | ORAL | 0 refills | Status: DC

## 2021-08-01 NOTE — Progress Notes (Signed)
Office Visit Note   Patient: Mackenzie Dudley           Date of Birth: 09-06-1956           MRN: [id number] Visit Date: 08/01/2021              Requested by: Blair Heys, MD [address] [place],  [place] [address] PCP: Blair Heys, MD   Assessment & Plan: Visit Diagnoses:  1. Chronic bilateral low back pain with right-sided sciatica     Plan: I did tell her to essential that we get her into a outpatient physical therapy program for her back even if it is just a few visits to transition to home exercise program.  I would also like to put her on a 6-day steroid taper and try some Zanaflex as a muscle relaxant.  All questions and concerns were answered and addressed.  She agrees with this treatment plan.  We will see her back in 4 weeks to see how she is doing overall.  Follow-Up Instructions: Return in about 4 weeks (around 08/29/2021).   Orders:  Orders Placed This Encounter  Procedures   XR Lumbar Spine 2-3 Views   Meds ordered this encounter  Medications   methylPREDNISolone (MEDROL) 4 MG tablet    Sig: Medrol dose pack. Take as instructed    Dispense:  21 tablet    Refill:  0   tiZANidine (ZANAFLEX) 4 MG tablet    Sig: Take 1 tablet (4 mg total) by mouth every 8 (eight) hours as needed for muscle spasms.    Dispense:  30 tablet    Refill:  0      Procedures: No procedures performed   Clinical Data: No additional findings.   Subjective: Chief Complaint  Patient presents with   Lower Back - Pain  The patient is a 65 year old female who comes in for evaluation and treatment of several months of worsening low back pain.  It hurts across her back and into the sciatic region on both sides with the right worse than left.  She denies any specific injuries and this is slowly been getting worse.  She has been is trying over-the-counter anti-inflammatories.  She is never had any back surgery before.  She is not diabetic.  She denies any weakness in her  legs but does get occasional numbness and tingling in her feet.  HPI  Review of Systems There is currently listed no headache, chest pain, shortness of breath, fever, chills, nausea, vomiting  Objective: Vital Signs: There were no vitals taken for this visit.  Physical Exam She is alert and oriented x3 and in no acute distress Ortho Exam Examination of her lumbar spine does show pain across the lower lumbar spine medial and lateral in the midline.  There is negative straight leg raise bilaterally and normal sensation and normal muscle tone with good strength in bilateral lower extremities.  She does have a slow and difficult time getting up from a seated position and limited flexion and extension of the lumbar spine secondary to pain. Specialty Comments:  No specialty comments available.  Imaging: XR Lumbar Spine 2-3 Views  Result Date: 08/01/2021 2 views of the lumbar spine show severe degenerative disc disease between L5 and S1.  There is no malalignment.    PMFS History: There are no problems to display for this patient.  Past Medical History:  Diagnosis Date   High cholesterol     No family history on  file.  No past surgical history on file. Social History   Occupational History   Not on file  Tobacco Use   Smoking status: Former    Types: Cigarettes   Smokeless tobacco: Never  Substance and Sexual Activity   Alcohol use: Yes    Comment: 1 beer/ day   Drug use: No   Sexual activity: Not on file

## 2021-08-12 ENCOUNTER — Ambulatory Visit: Payer: Medicare HMO | Admitting: Orthopaedic Surgery

## 2021-08-23 ENCOUNTER — Other Ambulatory Visit: Payer: Self-pay

## 2021-08-23 ENCOUNTER — Ambulatory Visit: Payer: Medicare HMO | Admitting: Podiatry

## 2021-08-23 DIAGNOSIS — Q828 Other specified congenital malformations of skin: Secondary | ICD-10-CM | POA: Diagnosis not present

## 2021-08-28 ENCOUNTER — Telehealth: Payer: Self-pay | Admitting: Orthopaedic Surgery

## 2021-08-28 ENCOUNTER — Other Ambulatory Visit: Payer: Self-pay | Admitting: Orthopaedic Surgery

## 2021-08-28 MED ORDER — METHYLPREDNISOLONE 4 MG PO TABS: ORAL_TABLET | ORAL | 0 refills | Status: DC

## 2021-08-28 NOTE — Telephone Encounter (Signed)
Pt received print out of exercises and has had success with them helping. Pt is asking for a methylprednisolone refill. The best pharmacy is the one on file and the best call back number if needed is [phone].

## 2021-08-29 ENCOUNTER — Ambulatory Visit: Payer: Medicare HMO | Admitting: Orthopaedic Surgery

## 2021-08-30 NOTE — Progress Notes (Signed)
Subjective:  Patient ID: Mackenzie Dudley, female    DOB: [birth year],  MRN: [id number]  Chief Complaint  Patient presents with   Callouses    Bilateral callus     65 y.o. female presents with the above complaint.  Patient presents with complaint bilateral submetatarsal 3 porokeratotic lesions.  Patient states is painful to touch painful to walk on.  She would like to get it evaluated.  She has not seen and was prior to seeing me.  She wears regular shoes.  Painful with ambulation pain scale is 8 out of 10.  She states that hurts a lot and now she is compensating and limping.  She would like to discuss treatment options for this.   Review of Systems: Negative except as noted in the HPI. Denies N/V/F/Ch.  Past Medical History:  Diagnosis Date   High cholesterol     Current Outpatient Medications:    acetaminophen (TYLENOL) 500 MG tablet, Take 500 mg by mouth every 6 (six) hours as needed., Disp: , Rfl:    bisacodyl (DULCOLAX) 5 MG EC tablet, Take 5 mg by mouth daily as needed for moderate constipation., Disp: , Rfl:    HYDROcodone-acetaminophen (NORCO/VICODIN) 5-325 MG per tablet, Take 1 tablet by mouth every 4 (four) hours as needed., Disp: , Rfl:    methylPREDNISolone (MEDROL) 4 MG tablet, Medrol dose pack. Take as instructed, Disp: 21 tablet, Rfl: 0   nabumetone (RELAFEN) 500 MG tablet, TAKE 1 TABLET(500 MG) BY MOUTH TWICE DAILY AS NEEDED, Disp: 60 tablet, Rfl: 1   naproxen sodium (ANAPROX) 220 MG tablet, Take 220 mg by mouth 2 (two) times daily with a meal., Disp: , Rfl:    pravastatin (PRAVACHOL) 40 MG tablet, Take 1 tablet by mouth daily., Disp: , Rfl:    promethazine (PHENERGAN) 25 MG tablet, Take 25 mg by mouth every 6 (six) hours as needed for nausea or vomiting., Disp: , Rfl:    simethicone (MYLICON) 80 MG chewable tablet, Chew 80 mg by mouth every 6 (six) hours as needed for flatulence., Disp: , Rfl:    tamsulosin (FLOMAX) 0.4 MG CAPS capsule, Take 1 capsule by mouth daily., Disp:  , Rfl:    tiZANidine (ZANAFLEX) 4 MG tablet, Take 1 tablet (4 mg total) by mouth every 8 (eight) hours as needed for muscle spasms., Disp: 30 tablet, Rfl: 0   traMADol (ULTRAM) 50 MG tablet, Take 50 mg by mouth every 6 (six) hours as needed., Disp: , Rfl:    traZODone (DESYREL) 50 MG tablet, Take 50-150 mg by mouth at bedtime., Disp: , Rfl:   Social History   Tobacco Use  Smoking Status Former   Types: Cigarettes  Smokeless Tobacco Never    No Known Allergies Objective:  There were no vitals filed for this visit. There is no height or weight on file to calculate BMI. Constitutional Well developed. Well nourished.  Vascular Dorsalis pedis pulses palpable bilaterally. Posterior tibial pulses palpable bilaterally. Capillary refill normal to all digits.  No cyanosis or clubbing noted. Pedal hair growth normal.  Neurologic Normal speech. Oriented to person, place, and time. Epicritic sensation to light touch grossly present bilaterally.  Dermatologic Hyperkeratotic lesion noted with central nucleated core to bilateral submetatarsal 3.  Pain on palpation to the lesion.  No pinpoint bleeding noted.  No ulceration noted.  Orthopedic: Normal joint ROM without pain or crepitus bilaterally. No visible deformities. No bony tenderness.   Radiographs: None Assessment:   1. Porokeratosis    Plan:  Patient was evaluated and treated and all questions answered.  Bilateral submetatarsal 3 porokeratosis -I explained the patient the etiology of porokeratosis and various treatment options were extensively discussed.  Given the amount of pain that she is having I believe she will benefit from aggressive debridement of the lesion followed by excision of central nucleated core.  Patient states understand would like to proceed with that -Using chisel blade to handle the lesion was debrided down to healthy striated tissue followed by excision of central nucleated core.  No pinpoint bleeding noted no  complication noted.  No follow-ups on file.

## 2021-09-23 DIAGNOSIS — Z23 Encounter for immunization: Secondary | ICD-10-CM | POA: Diagnosis not present

## 2021-09-23 DIAGNOSIS — L200 Besnier's prurigo: Secondary | ICD-10-CM | POA: Diagnosis not present

## 2021-10-11 DIAGNOSIS — L258 Unspecified contact dermatitis due to other agents: Secondary | ICD-10-CM | POA: Diagnosis not present

## 2021-10-16 ENCOUNTER — Telehealth: Payer: Self-pay | Admitting: *Deleted

## 2021-10-16 NOTE — Telephone Encounter (Signed)
Patient is calling to cancel upcoming appointment. Please call back to reschedule. 

## 2021-10-18 ENCOUNTER — Ambulatory Visit: Payer: Medicare HMO | Admitting: Podiatry

## 2021-10-23 ENCOUNTER — Ambulatory Visit: Payer: Medicare HMO | Admitting: Podiatry

## 2021-11-06 ENCOUNTER — Ambulatory Visit: Payer: Medicare HMO | Admitting: Podiatry

## 2021-11-06 ENCOUNTER — Other Ambulatory Visit: Payer: Self-pay

## 2021-11-06 DIAGNOSIS — Q828 Other specified congenital malformations of skin: Secondary | ICD-10-CM

## 2021-11-08 NOTE — Progress Notes (Signed)
Subjective:  Patient ID: Mackenzie Dudley, female    DOB: 31-Jul-1956,  MRN: [id number]  Chief Complaint  Patient presents with   Callouses    Bilateral callus     65 y.o. female presents with the above complaint.  Patient presents with follow-up of bilateral submetatarsal 3 porokeratotic lesion/benign skin lesion.  Patient states that there is too painful to touch has gotten better after debridement.  She wanted to discuss taking them out but not today.  She denies any other acute issues.   Review of Systems: Negative except as noted in the HPI. Denies N/V/F/Ch.  Past Medical History:  Diagnosis Date   High cholesterol     Current Outpatient Medications:    acetaminophen (TYLENOL) 500 MG tablet, Take 500 mg by mouth every 6 (six) hours as needed., Disp: , Rfl:    bisacodyl (DULCOLAX) 5 MG EC tablet, Take 5 mg by mouth daily as needed for moderate constipation., Disp: , Rfl:    HYDROcodone-acetaminophen (NORCO/VICODIN) 5-325 MG per tablet, Take 1 tablet by mouth every 4 (four) hours as needed., Disp: , Rfl:    methylPREDNISolone (MEDROL) 4 MG tablet, Medrol dose pack. Take as instructed, Disp: 21 tablet, Rfl: 0   nabumetone (RELAFEN) 500 MG tablet, TAKE 1 TABLET(500 MG) BY MOUTH TWICE DAILY AS NEEDED, Disp: 60 tablet, Rfl: 1   naproxen sodium (ANAPROX) 220 MG tablet, Take 220 mg by mouth 2 (two) times daily with a meal., Disp: , Rfl:    pravastatin (PRAVACHOL) 40 MG tablet, Take 1 tablet by mouth daily., Disp: , Rfl:    promethazine (PHENERGAN) 25 MG tablet, Take 25 mg by mouth every 6 (six) hours as needed for nausea or vomiting., Disp: , Rfl:    simethicone (MYLICON) 80 MG chewable tablet, Chew 80 mg by mouth every 6 (six) hours as needed for flatulence., Disp: , Rfl:    tamsulosin (FLOMAX) 0.4 MG CAPS capsule, Take 1 capsule by mouth daily., Disp: , Rfl:    tiZANidine (ZANAFLEX) 4 MG tablet, Take 1 tablet (4 mg total) by mouth every 8 (eight) hours as needed for muscle spasms., Disp: 30  tablet, Rfl: 0   traMADol (ULTRAM) 50 MG tablet, Take 50 mg by mouth every 6 (six) hours as needed., Disp: , Rfl:    traZODone (DESYREL) 50 MG tablet, Take 50-150 mg by mouth at bedtime., Disp: , Rfl:   Social History   Tobacco Use  Smoking Status Former   Types: Cigarettes  Smokeless Tobacco Never    No Known Allergies Objective:  There were no vitals filed for this visit. There is no height or weight on file to calculate BMI. Constitutional Well developed. Well nourished.  Vascular Dorsalis pedis pulses palpable bilaterally. Posterior tibial pulses palpable bilaterally. Capillary refill normal to all digits.  No cyanosis or clubbing noted. Pedal hair growth normal.  Neurologic Normal speech. Oriented to person, place, and time. Epicritic sensation to light touch grossly present bilaterally.  Dermatologic Hyperkeratotic lesion noted with central nucleated core to bilateral submetatarsal 3.  Pain on palpation to the lesion.  No pinpoint bleeding noted.  No ulceration noted.  Orthopedic: Normal joint ROM without pain or crepitus bilaterally. No visible deformities. No bony tenderness.   Radiographs: None Assessment:   1. Porokeratosis     Plan:  Patient was evaluated and treated and all questions answered.  Bilateral submetatarsal 3 porokeratosis -I explained the patient the etiology of porokeratosis and various treatment options were extensively discussed.  Given the amount  of pain that she is having I believe she will benefit from aggressive debridement of the lesion followed by excision of central nucleated core.  Patient states understand would like to proceed with that -Using chisel blade to handle the lesion was debrided down to healthy striated tissue followed by excision of central nucleated core.  No pinpoint bleeding noted no complication noted. -We will plan on removing them with punch excision if they continue to be painful.  No follow-ups on file.

## 2021-11-27 ENCOUNTER — Ambulatory Visit: Payer: Medicare HMO | Admitting: Podiatry

## 2021-12-06 ENCOUNTER — Encounter: Payer: Self-pay | Admitting: Podiatry

## 2021-12-06 ENCOUNTER — Other Ambulatory Visit: Payer: Self-pay

## 2021-12-06 ENCOUNTER — Ambulatory Visit: Payer: Medicare HMO | Admitting: Podiatry

## 2021-12-06 DIAGNOSIS — L989 Disorder of the skin and subcutaneous tissue, unspecified: Secondary | ICD-10-CM | POA: Diagnosis not present

## 2021-12-06 DIAGNOSIS — Q828 Other specified congenital malformations of skin: Secondary | ICD-10-CM | POA: Diagnosis not present

## 2021-12-10 ENCOUNTER — Encounter: Payer: Self-pay | Admitting: Podiatry

## 2021-12-10 NOTE — Progress Notes (Signed)
Subjective:  Patient ID: Mackenzie Dudley, female    DOB: 10-10-1956,  MRN: [id number]  No chief complaint on file.   65 y.o. female presents with the above complaint.  Patient presents with follow-up of bilateral submetatarsal 3 porokeratotic lesion/benign skin lesion.  She states this keeps coming back is very painful to touch.  She would like to have it taken out completely.  She denies any other acute complaints..   Review of Systems: Negative except as noted in the HPI. Denies N/V/F/Ch.  Past Medical History:  Diagnosis Date   High cholesterol     Current Outpatient Medications:    acetaminophen (TYLENOL) 500 MG tablet, Take 500 mg by mouth every 6 (six) hours as needed., Disp: , Rfl:    bisacodyl (DULCOLAX) 5 MG EC tablet, Take 5 mg by mouth daily as needed for moderate constipation., Disp: , Rfl:    HYDROcodone-acetaminophen (NORCO/VICODIN) 5-325 MG per tablet, Take 1 tablet by mouth every 4 (four) hours as needed., Disp: , Rfl:    methylPREDNISolone (MEDROL) 4 MG tablet, Medrol dose pack. Take as instructed, Disp: 21 tablet, Rfl: 0   nabumetone (RELAFEN) 500 MG tablet, TAKE 1 TABLET(500 MG) BY MOUTH TWICE DAILY AS NEEDED, Disp: 60 tablet, Rfl: 1   naproxen sodium (ANAPROX) 220 MG tablet, Take 220 mg by mouth 2 (two) times daily with a meal., Disp: , Rfl:    pravastatin (PRAVACHOL) 40 MG tablet, Take 1 tablet by mouth daily., Disp: , Rfl:    promethazine (PHENERGAN) 25 MG tablet, Take 25 mg by mouth every 6 (six) hours as needed for nausea or vomiting., Disp: , Rfl:    simethicone (MYLICON) 80 MG chewable tablet, Chew 80 mg by mouth every 6 (six) hours as needed for flatulence., Disp: , Rfl:    tamsulosin (FLOMAX) 0.4 MG CAPS capsule, Take 1 capsule by mouth daily., Disp: , Rfl:    tiZANidine (ZANAFLEX) 4 MG tablet, Take 1 tablet (4 mg total) by mouth every 8 (eight) hours as needed for muscle spasms., Disp: 30 tablet, Rfl: 0   traMADol (ULTRAM) 50 MG tablet, Take 50 mg by mouth  every 6 (six) hours as needed., Disp: , Rfl:    traZODone (DESYREL) 50 MG tablet, Take 50-150 mg by mouth at bedtime., Disp: , Rfl:   Social History   Tobacco Use  Smoking Status Former   Types: Cigarettes  Smokeless Tobacco Never    No Known Allergies Objective:  There were no vitals filed for this visit. There is no height or weight on file to calculate BMI. Constitutional Well developed. Well nourished.  Vascular Dorsalis pedis pulses palpable bilaterally. Posterior tibial pulses palpable bilaterally. Capillary refill normal to all digits.  No cyanosis or clubbing noted. Pedal hair growth normal.  Neurologic Normal speech. Oriented to person, place, and time. Epicritic sensation to light touch grossly present bilaterally.  Dermatologic Hyperkeratotic lesion noted with central nucleated core to bilateral submetatarsal 3.  Pain on palpation to the lesion.  No pinpoint bleeding noted.  No ulceration noted.  Orthopedic: Normal joint ROM without pain or crepitus bilaterally. No visible deformities. No bony tenderness.   Radiographs: None Assessment:   1. Porokeratosis   2. Benign skin lesion      Plan:  Patient was evaluated and treated and all questions answered.  Bilateral submetatarsal 3 porokeratosis -Given that clinically this is continues to coming back I discussed with the patient she will benefit from excision of the lesion.  Patient agrees the plan  would like to proceed with excision of benign skin lesion via punch technique. -Excision of the skin lesion Skin was prepped in standard technique with Betadine.  One-to-one mixture of 1% lidocaine plain half percent Marcaine plain was injected circumferentially in V-block fashion 3 cc.  3 mm punch biopsy was utilized to remove the lesion in its entirety down to the level of the subcutaneous tissue.  The wound site was dressed with triple antibiotic 4 x 4 gauze Kerlix and Ace bandage.  I have asked the patient to keep it  covered with Neosporin and a Band-Aid until healing.  I will see her back again in 6 weeks   No follow-ups on file.

## 2022-04-22 ENCOUNTER — Other Ambulatory Visit: Payer: Self-pay | Admitting: Physician Assistant

## 2022-04-22 DIAGNOSIS — Z1231 Encounter for screening mammogram for malignant neoplasm of breast: Secondary | ICD-10-CM

## 2022-04-29 NOTE — Patient Instructions (Signed)

## 2022-04-30 ENCOUNTER — Encounter: Payer: Self-pay | Admitting: Physician Assistant

## 2022-04-30 ENCOUNTER — Other Ambulatory Visit: Payer: Self-pay | Admitting: Physician Assistant

## 2022-04-30 ENCOUNTER — Ambulatory Visit (INDEPENDENT_AMBULATORY_CARE_PROVIDER_SITE_OTHER): Payer: Medicare HMO | Admitting: Physician Assistant

## 2022-04-30 VITALS — BP 148/72 | HR 95 | Temp 97.7°F | Ht 61.0 in | Wt 179.0 lb

## 2022-04-30 DIAGNOSIS — R030 Elevated blood-pressure reading, without diagnosis of hypertension: Secondary | ICD-10-CM | POA: Diagnosis not present

## 2022-04-30 DIAGNOSIS — M5441 Lumbago with sciatica, right side: Secondary | ICD-10-CM

## 2022-04-30 DIAGNOSIS — I10 Essential (primary) hypertension: Secondary | ICD-10-CM

## 2022-04-30 DIAGNOSIS — L209 Atopic dermatitis, unspecified: Secondary | ICD-10-CM | POA: Diagnosis not present

## 2022-04-30 DIAGNOSIS — N3281 Overactive bladder: Secondary | ICD-10-CM

## 2022-04-30 DIAGNOSIS — E785 Hyperlipidemia, unspecified: Secondary | ICD-10-CM

## 2022-04-30 DIAGNOSIS — G8929 Other chronic pain: Secondary | ICD-10-CM

## 2022-04-30 DIAGNOSIS — Z7689 Persons encountering health services in other specified circumstances: Secondary | ICD-10-CM

## 2022-04-30 MED ORDER — OXYBUTYNIN CHLORIDE 5 MG PO TABS: 5.0000 mg | ORAL_TABLET | Freq: Two times a day (BID) | ORAL | 1 refills | Status: DC

## 2022-04-30 NOTE — Progress Notes (Signed)
? ?New Patient Office Visit ? ?Subjective   ? ?Patient ID: Mackenzie Dudley, female    DOB: 03/22/1956  Age: 66 y.o. MRN: [id number] ? ?CC:  ?Chief Complaint  ?Patient presents with  ? New Patient (Initial Visit)  ? ? ?HPI ?Mackenzie Dudley presents to establish care. Patient has a past medical history of hyperlipidemia. Patient reports her previous PCP increased pravastatin to 40 mg which caused cold intolerance so dose was lowered to 20 mg. Tolerating medication dose without issues. Patient has seen orthopedics in the past for chronic back pain. Takes a muscle relaxer when needed. In the past has been on Flomax to help with frequent urination which also occurs at night time. Denies urinary leakage or incontinence. Reports went to the [place] on a trip and when returning developed an itchy rash in both arms and chest. Has been using her niece's cream which has helped. No pertinent surgeries. States stopped smoking 01/22/2022, reports smoked a cigarette or two here and there.  ? ?Outpatient Encounter Medications as of 04/30/2022  ?Medication Sig  ? diphenhydrAMINE (BENADRYL) 25 MG tablet Take 25 mg by mouth every 6 (six) hours as needed.  ? hydrocortisone cream 1 % Apply 1 application. topically 2 (two) times daily.  ? methocarbamol (ROBAXIN) 500 MG tablet Take 500 mg by mouth 3 (three) times daily.  ? naproxen sodium (ANAPROX) 220 MG tablet Take 220 mg by mouth 2 (two) times daily with a meal.  ? oxybutynin (DITROPAN) 5 MG tablet Take 1 tablet (5 mg total) by mouth 2 (two) times daily.  ? Propylene Glycol (SYSTANE BALANCE) 0.6 % SOLN Apply to Dudley.  ? rosuvastatin (CRESTOR) 20 MG tablet Take 20 mg by mouth daily.  ? Ruxolitinib Phosphate (OPZELURA) 1.5 % CREA Apply topically. Apply to affected area  ? tiZANidine (ZANAFLEX) 4 MG tablet Take 1 tablet (4 mg total) by mouth every 8 (eight) hours as needed for muscle spasms.  ? [DISCONTINUED] acetaminophen (TYLENOL) 500 MG tablet Take 500 mg by mouth every 6 (six) hours as  needed. (Patient not taking: Reported on 04/30/2022)  ? [DISCONTINUED] bisacodyl (DULCOLAX) 5 MG EC tablet Take 5 mg by mouth daily as needed for moderate constipation. (Patient not taking: Reported on 04/30/2022)  ? [DISCONTINUED] HYDROcodone-acetaminophen (NORCO/VICODIN) 5-325 MG per tablet Take 1 tablet by mouth every 4 (four) hours as needed. (Patient not taking: Reported on 04/30/2022)  ? [DISCONTINUED] methylPREDNISolone (MEDROL) 4 MG tablet Medrol dose pack. Take as instructed (Patient not taking: Reported on 04/30/2022)  ? [DISCONTINUED] nabumetone (RELAFEN) 500 MG tablet TAKE 1 TABLET(500 MG) BY MOUTH TWICE DAILY AS NEEDED (Patient not taking: Reported on 04/30/2022)  ? [DISCONTINUED] pravastatin (PRAVACHOL) 40 MG tablet Take 1 tablet by mouth daily. (Patient not taking: Reported on 04/30/2022)  ? [DISCONTINUED] promethazine (PHENERGAN) 25 MG tablet Take 25 mg by mouth every 6 (six) hours as needed for nausea or vomiting. (Patient not taking: Reported on 04/30/2022)  ? [DISCONTINUED] simethicone (MYLICON) 80 MG chewable tablet Chew 80 mg by mouth every 6 (six) hours as needed for flatulence. (Patient not taking: Reported on 04/30/2022)  ? [DISCONTINUED] tamsulosin (FLOMAX) 0.4 MG CAPS capsule Take 1 capsule by mouth daily. (Patient not taking: Reported on 04/30/2022)  ? [DISCONTINUED] traMADol (ULTRAM) 50 MG tablet Take 50 mg by mouth every 6 (six) hours as needed. (Patient not taking: Reported on 04/30/2022)  ? [DISCONTINUED] traZODone (DESYREL) 50 MG tablet Take 50-150 mg by mouth at bedtime. (Patient not taking: Reported on 04/30/2022)  ? ?  No facility-administered encounter medications on file as of 04/30/2022.  ? ? ?Past Medical History:  ?Diagnosis Date  ? High cholesterol   ? ? ?No past surgical history on file. ? ?No family history on file. ? ?Social History  ? ?Socioeconomic History  ? Marital status: Widowed  ?  Spouse name: Not on file  ? Number of children: Not on file  ? Years of education: Not on file  ?  Highest education level: Not on file  ?Occupational History  ? Occupation: Retired  ?Tobacco Use  ? Smoking status: Former  ?  Types: Cigarettes  ?  Quit date: 01/22/2022  ?  Years since quitting: 0.2  ? Smokeless tobacco: Never  ?Vaping Use  ? Vaping Use: Never used  ?Substance and Sexual Activity  ? Alcohol use: Yes  ?  Comment: 1 beer/ day  ? Drug use: No  ? Sexual activity: Not Currently  ?Other Topics Concern  ? Not on file  ?Social History Narrative  ? Not on file  ? ?Social Determinants of Health  ? ?Financial Resource Strain: Not on file  ?Food Insecurity: Not on file  ?Transportation Needs: Not on file  ?Physical Activity: Not on file  ?Stress: Not on file  ?Social Connections: Not on file  ?Intimate Partner Violence: Not on file  ? ? ?ROS ?Review of Systems:  ?A fourteen system review of systems was performed and found to be positive as per HPI. ? ?  ? ? ?Objective   ? ?BP (!) 148/72   Pulse 95   Temp 97.7 ?F (36.5 ?C)   Ht 5\' 1"  (1.549 m)   Wt 179 lb (81.2 kg)   SpO2 100%   BMI 33.82 kg/m?  ? ?Physical Exam ?General:  Well Developed, well nourished, appropriate for stated age.  ?Neuro:  Alert and oriented,  extra-ocular muscles intact  ?HEENT:  Normocephalic, atraumatic, neck supple  ?Skin: dark gray patches noted on bilateral arms including antecubital fossa of left arm  ?Cardiac:  RRR, S1 S2 ?Respiratory: CTA B/L w/o wheezing, crackles or rales. ?Vascular:  Ext warm, no cyanosis apprec.; cap RF less 2 sec. ?Psych:  No HI/SI, judgement and insight good, Euthymic mood. Full Affect. ? ? ? ? ?  ? ?Assessment & Plan:  ? ?Problem List Items Addressed This Visit   ?None ?Visit Diagnoses   ? ? Elevated blood pressure reading in office without diagnosis of hypertension    -  Primary  ? Hyperlipidemia, unspecified hyperlipidemia type      ? Relevant Medications  ? rosuvastatin (CRESTOR) 20 MG tablet  ? Encounter to establish care      ? Overactive bladder      ? Relevant Medications  ? oxybutynin (DITROPAN) 5  MG tablet  ? Atopic dermatitis, unspecified type      ? Chronic bilateral low back pain with right-sided sciatica      ? Relevant Medications  ? methocarbamol (ROBAXIN) 500 MG tablet  ? ?  ? ? ?Encounter to establish care: ?-Will request [facility]CP [facility]. ?-Recommend to schedule CPE and routine fasting labs. ? ?Hyperlipidemia: ?-Will collect a fasting lipid panel with annual physical. ?-Continue pravastatin 20 mg.  ?-Will continue to monitor. ? ?Overactive bladder: ?-Discussed with patient has s/sx more consistent with overactive bladder. Will trial oxybutynin 5 mg BID.  ?-Will continue to monitor. ? ?Elevated blood pressure reading in office without diagnosis of hypertension: ?-Pt denies prior hx of hypertension. ?-BP elevated in office  with mild improvement after repeat. Advised to start monitoring BP/pulse at home daily and keep a log to bring to follow-up visit. ?-Will repeat BP at f/up visit.  ? ?Atopic dermatitis: ?-Discussed with patient rash is suggestive of atopic dermatitis and ok to continue Opzelura cream, advised to not use more than twice daily. ?-Will continue to monitor.  ? ?Chronic bilateral low back pain with right-sided sciatica: ?-Followed by Orthopedics. ?-Lumbar XR 08/01/2021 revealed severe lumbar DDD. ?-On tizanidine 4 mg q8 hrs as needed.  ? ? ?Return in about 4 weeks (around 05/28/2022) for CPE w/ Pap and FBW.  ? ?Mayer Masker, PA-C ? ? ?

## 2022-05-21 DIAGNOSIS — Z0100 Encounter for examination of eyes and vision without abnormal findings: Secondary | ICD-10-CM | POA: Diagnosis not present

## 2022-05-21 DIAGNOSIS — H5210 Myopia, unspecified eye: Secondary | ICD-10-CM | POA: Diagnosis not present

## 2022-05-23 ENCOUNTER — Ambulatory Visit
Admission: RE | Admit: 2022-05-23 | Discharge: 2022-05-23 | Disposition: A | Discharge status: Home / Self Care | Payer: Medicare HMO | Source: Ambulatory Visit | Attending: Physician Assistant | Admitting: Physician Assistant

## 2022-05-23 DIAGNOSIS — L259 Unspecified contact dermatitis, unspecified cause: Secondary | ICD-10-CM | POA: Diagnosis not present

## 2022-05-23 DIAGNOSIS — Z1231 Encounter for screening mammogram for malignant neoplasm of breast: Secondary | ICD-10-CM | POA: Diagnosis not present

## 2022-06-05 ENCOUNTER — Encounter: Payer: Self-pay | Admitting: Physician Assistant

## 2022-06-05 ENCOUNTER — Ambulatory Visit (INDEPENDENT_AMBULATORY_CARE_PROVIDER_SITE_OTHER): Payer: Medicare HMO | Admitting: Physician Assistant

## 2022-06-05 VITALS — BP 120/73 | HR 81 | Temp 97.7°F | Ht 61.0 in | Wt 181.0 lb

## 2022-06-05 DIAGNOSIS — Z0000 Encounter for general adult medical examination without abnormal findings: Secondary | ICD-10-CM | POA: Diagnosis not present

## 2022-06-05 DIAGNOSIS — R4182 Altered mental status, unspecified: Secondary | ICD-10-CM | POA: Diagnosis not present

## 2022-06-05 DIAGNOSIS — Z131 Encounter for screening for diabetes mellitus: Secondary | ICD-10-CM | POA: Diagnosis not present

## 2022-06-05 DIAGNOSIS — F430 Acute stress reaction: Secondary | ICD-10-CM | POA: Insufficient documentation

## 2022-06-05 DIAGNOSIS — Z860101 Personal history of adenomatous and serrated colon polyps: Secondary | ICD-10-CM | POA: Insufficient documentation

## 2022-06-05 DIAGNOSIS — Z780 Asymptomatic menopausal state: Secondary | ICD-10-CM

## 2022-06-05 DIAGNOSIS — G479 Sleep disorder, unspecified: Secondary | ICD-10-CM | POA: Insufficient documentation

## 2022-06-05 DIAGNOSIS — F17200 Nicotine dependence, unspecified, uncomplicated: Secondary | ICD-10-CM | POA: Insufficient documentation

## 2022-06-05 DIAGNOSIS — E2839 Other primary ovarian failure: Secondary | ICD-10-CM

## 2022-06-05 DIAGNOSIS — H9190 Unspecified hearing loss, unspecified ear: Secondary | ICD-10-CM | POA: Insufficient documentation

## 2022-06-05 DIAGNOSIS — L200 Besnier's prurigo: Secondary | ICD-10-CM | POA: Insufficient documentation

## 2022-06-05 DIAGNOSIS — E785 Hyperlipidemia, unspecified: Secondary | ICD-10-CM | POA: Diagnosis not present

## 2022-06-05 DIAGNOSIS — E7800 Pure hypercholesterolemia, unspecified: Secondary | ICD-10-CM | POA: Insufficient documentation

## 2022-06-05 DIAGNOSIS — Z1159 Encounter for screening for other viral diseases: Secondary | ICD-10-CM | POA: Diagnosis not present

## 2022-06-05 DIAGNOSIS — B070 Plantar wart: Secondary | ICD-10-CM | POA: Insufficient documentation

## 2022-06-05 DIAGNOSIS — Z86010 Personal history of colonic polyps: Secondary | ICD-10-CM | POA: Insufficient documentation

## 2022-06-05 MED ORDER — ROSUVASTATIN CALCIUM 20 MG PO TABS: 20.0000 mg | ORAL_TABLET | Freq: Every day | ORAL | 0 refills | Status: DC

## 2022-06-05 NOTE — Progress Notes (Addendum)
Subjective:   Mackenzie Dudley is a 66 y.o. female who presents for Medicare Annual (Subsequent) preventive examination.  Review of Systems    Refer to PCP  I connected with  Mackenzie Dudley on 06/05/22 by an audio only telemedicine application and verified that I am speaking with the correct person using two identifiers.   I discussed the limitations, risks, security and privacy concerns of performing an evaluation and management service by telephone and the availability of in person appointments. I also discussed with the patient that there may be a patient responsible charge related to this service. The patient expressed understanding and verbally consented to this telephonic visit.  Location of Patient: Restaurant manager, fast food of Provider: Office  List any persons and their role that are participating in the visit with the patient.  Mackenzie Dudley, CMA       Objective:    Today's Vitals   06/05/22 0921  Pulse: 81  Temp: 97.7 F (36.5 C)  SpO2: 100%  Weight: 181 lb (82.1 kg)  Height: 5\' 1"  (1.549 m)   Body mass index is 34.2 kg/m.      No data to display          Current Medications (verified) Outpatient Encounter Medications as of 06/05/2022  Medication Sig   diphenhydrAMINE (BENADRYL) 25 MG tablet Take 25 mg by mouth every 6 (six) hours as needed.   hydrocortisone cream 1 % Apply 1 application. topically 2 (two) times daily.   methocarbamol (ROBAXIN) 500 MG tablet Take 500 mg by mouth 3 (three) times daily.   naproxen sodium (ANAPROX) 220 MG tablet Take 220 mg by mouth 2 (two) times daily with a meal.   oxybutynin (DITROPAN) 5 MG tablet Take 1 tablet (5 mg total) by mouth 2 (two) times daily.   Propylene Glycol (SYSTANE BALANCE) 0.6 % SOLN Apply to eye.   rosuvastatin (CRESTOR) 20 MG tablet Take 20 mg by mouth daily.   Ruxolitinib Phosphate (OPZELURA) 1.5 % CREA Apply topically. Apply to affected area   tiZANidine (ZANAFLEX) 4 MG tablet Take 1 tablet (4 mg total) by  mouth every 8 (eight) hours as needed for muscle spasms.   No facility-administered encounter medications on file as of 06/05/2022.    Allergies (verified) Atorvastatin calcium and Pravastatin sodium   History: Past Medical History:  Diagnosis Date   High cholesterol    History reviewed. No pertinent surgical history. History reviewed. No pertinent family history. Social History   Socioeconomic History   Marital status: Widowed    Spouse name: Not on file   Number of children: 4   Years of education: Not on file   Highest education level: GED or equivalent  Occupational History   Occupation: Retired  Tobacco Use   Smoking status: Former    Packs/day: 0.50    Types: Cigarettes    Start date: 25    Quit date: 01/22/2022    Years since quitting: 0.3   Smokeless tobacco: Never  Vaping Use   Vaping Use: Never used  Substance and Sexual Activity   Alcohol use: Yes    Alcohol/week: 1.0 standard drink of alcohol    Types: 1 Cans of beer per week    Comment: 1 beer/ day   Drug use: No   Sexual activity: Not Currently  Other Topics Concern   Not on file  Social History Narrative   Not on file   Social Determinants of Health   Financial Resource Strain: Low Risk  (  06/05/2022)   Overall Financial Resource Strain (CARDIA)    Difficulty of Paying Living Expenses: Not hard at all  Food Insecurity: No Food Insecurity (06/05/2022)   Hunger Vital Sign    Worried About Running Out of Food in the Last Year: Never true    Ran Out of Food in the Last Year: Never true  Transportation Needs: No Transportation Needs (06/05/2022)   PRAPARE - Hydrologist (Medical): No    Lack of Transportation (Non-Medical): No  Physical Activity: Inactive (06/05/2022)   Exercise Vital Sign    Days of Exercise per Week: 0 days    Minutes of Exercise per Session: 0 min  Stress: No Stress Concern Present (06/05/2022)   [place]    Feeling of Stress : Not at all  Social Connections: Socially Isolated (06/05/2022)   Social Connection and Isolation Panel [NHANES]    Frequency of Communication with Friends and Family: More than three times a week    Frequency of Social Gatherings with Friends and Family: More than three times a week    Attends Religious Services: Never    Marine scientist or Organizations: No    Attends Archivist Meetings: Never    Marital Status: Widowed    Tobacco Counseling Counseling given: Not Answered   Clinical Intake:                 Diabetic?No         Activities of Daily Living    06/05/2022    9:29 AM 04/30/2022   11:21 AM  In your present state of health, do you have any difficulty performing the following activities:  Hearing? 1 1  Vision? 0 0  Difficulty concentrating or making decisions? 0 0  Walking or climbing stairs? 1 1  Dressing or bathing? 0 0  Doing errands, shopping? 0 0    Patient Care Team: Lorrene Reid, PA-C as PCP - General (Physician Assistant)  Indicate any recent Medical Services you may have received from other than [facility] providers in the past year (date may be approximate).     Assessment:   This is a routine wellness examination for [place].  Hearing/Vision screen No results found.  Dietary issues and exercise activities discussed:     Goals Addressed   None    Depression Screen    06/05/2022    9:29 AM 04/30/2022   11:21 AM  PHQ 2/9 Scores  PHQ - 2 Score 0 0  PHQ- 9 Score 2 4    Fall Risk    06/05/2022    9:29 AM 04/30/2022   11:21 AM  Fall Risk   Falls in the past year? 1 1  Number falls in past yr: 1 1  Injury with Fall? 1 1  Risk for fall due to : History of fall(s) History of fall(s)  Follow up Falls evaluation completed Falls evaluation completed    Canon:  Any stairs in or around the home? Yes  If so, are there any without  handrails? Yes  Home free of loose throw rugs in walkways, pet beds, electrical cords, etc? Yes  Adequate lighting in your home to reduce risk of falls? Yes   ASSISTIVE DEVICES UTILIZED TO PREVENT FALLS:  Life alert? No  Use of a cane, walker or w/c? No  Grab bars in the bathroom? Yes  Shower chair or bench  in shower? No  Elevated toilet seat or a handicapped toilet? No   TIMED UP AND GO:  Was the test performed? Yes .  Length of time to ambulate 10 feet: 10 sec.   Gait slow and steady without use of assistive device  Cognitive Function:        06/05/2022    9:29 AM  6CIT Screen  What Year? 0 points  What month? 0 points  What time? 0 points  Count back from 20 0 points  Months in reverse 4 points  Repeat phrase 8 points  Total Score 12 points    Immunizations Immunization History  Administered Date(s) Administered   Influenza Split 09/04/2015   Influenza, High Dose Seasonal PF 09/23/2021   Influenza,inj,Quad PF,6+ Mos 12/07/2017, 09/13/2018   Influenza,inj,quad, With Preservative 09/14/2014   Pneumococcal Conjugate-13 09/14/2014   Td 11/21/2004   Tdap 10/29/2011   Zoster, Live 08/27/2016    TDAP status: Due, Education has been provided regarding the importance of this vaccine. Advised may receive this vaccine at local pharmacy or Health Dept. Aware to provide a copy of the vaccination record if obtained from local pharmacy or Health Dept. Verbalized acceptance and understanding.  Flu Vaccine status: Up to date  Pneumococcal vaccine status: Due, Education has been provided regarding the importance of this vaccine. Advised may receive this vaccine at local pharmacy or Health Dept. Aware to provide a copy of the vaccination record if obtained from local pharmacy or Health Dept. Verbalized acceptance and understanding.  Covid-19 vaccine status: Completed vaccines  Qualifies for Shingles Vaccine? Yes   Zostavax completed No   Shingrix Completed?: No.    Education  has been provided regarding the importance of this vaccine. Patient has been advised to call insurance company to determine out of pocket expense if they have not yet received this vaccine. Advised may also receive vaccine at local pharmacy or Health Dept. Verbalized acceptance and understanding.  Screening Tests Health Maintenance  Topic Date Due   COVID-19 Vaccine (1) Never done   Hepatitis C Screening  Never done   Zoster Vaccines- Shingrix (1 of 2) Never done   COLONOSCOPY (Pts 45-65yrs Insurance coverage will need to be confirmed)  Never done   Pneumonia Vaccine 61+ Years old (2 - PPSV23 if available, else PCV20) 11/09/2014   DEXA SCAN  Never done   TETANUS/TDAP  10/28/2021   INFLUENZA VACCINE  07/22/2022   MAMMOGRAM  05/23/2024   HPV VACCINES  Aged Out    Health Maintenance  Health Maintenance Due  Topic Date Due   COVID-19 Vaccine (1) Never done   Hepatitis C Screening  Never done   Zoster Vaccines- Shingrix (1 of 2) Never done   COLONOSCOPY (Pts 45-73yrs Insurance coverage will need to be confirmed)  Never done   Pneumonia Vaccine 99+ Years old (2 - PPSV23 if available, else PCV20) 11/09/2014   DEXA SCAN  Never done   TETANUS/TDAP  10/28/2021    Colonoscopy: Patient reports having one done every 5 years. She is going to get that information for our office. Currently can not remember the name of the [facility] provider.   Mammogram status: Completed 05/2022. Repeat every year  Dexa: Ordered today  Lung Cancer Screening: (Low Dose CT Chest recommended if Age 20-80 years, 30 pack-year currently smoking OR have quit w/in 15years.) does not qualify.   Lung Cancer Screening Referral:   Additional Screening:  Hepatitis C Screening: does qualify; Completed today  Vision Screening: Recommended annual ophthalmology  exams for early detection of glaucoma and other disorders of the eye. Is the patient up to date with their annual eye exam?  Yes  Who is the provider or what is the  name of the office in which the patient attends annual eye exams? Cant remember the name of eye doctor If pt is not established with a provider, would they like to be referred to a provider to establish care? No .   Dental Screening: Recommended annual dental exams for proper oral hygiene  Community Resource Referral / Chronic Care Management: CRR required this visit?  No  CCM required this visit?  NO    Plan:     I have personally reviewed and noted the following in the patient's chart:   Medical and social history Use of alcohol, tobacco or illicit drugs  Current medications and supplements including opioid prescriptions.  Functional ability and status Nutritional status Physical activity Advanced directives List of other physicians Hospitalizations, surgeries, and ER visits in previous 12 months Vitals Screenings to include cognitive, depression, and falls Referrals and appointments  In addition, I have reviewed and discussed with patient certain preventive protocols, quality metrics, and best practice recommendations. A written personalized care plan for preventive services as well as general preventive health recommendations were provided to patient.     [place], CMA   06/05/2022   Nurse Notes: Face to face 20 minutes.    Ms. Stahmer , Thank you for taking time to come for your Medicare Wellness Visit. I appreciate your ongoing commitment to your health goals. Please review the following plan we discussed and let me know if I can assist you in the future.   These are the goals we discussed:  Goals   None   TDAP vaccine Shingles vaccine Prevnar 20 Vaccine  Documentation of last Colonoscopy   This is a list of the screening recommended for you and due dates:  Health Maintenance  Topic Date Due   COVID-19 Vaccine (1) Never done   Hepatitis C Screening: USPSTF Recommendation to screen - Ages 68-79 yo.  Never done   Zoster (Shingles) Vaccine (1 of 2)  Never done   Colon Cancer Screening  Never done   Pneumonia Vaccine (2 - PPSV23 if available, else PCV20) 11/09/2014   DEXA scan (bone density measurement)  Never done   Tetanus Vaccine  10/28/2021   Flu Shot  07/22/2022   Mammogram  05/23/2024   HPV Vaccine  Aged Out

## 2022-06-05 NOTE — Patient Instructions (Signed)
*Centrum Silver Female Vitamin  Preventive Care 84 Years and Older, Female Preventive care refers to lifestyle choices and visits with your health care provider that can promote health and wellness. Preventive care visits are also called wellness exams. What can I expect for my preventive care visit? Counseling Your health care provider may ask you questions about your: Medical history, including: Past medical problems. Family medical history. Pregnancy and menstrual history. History of falls. Current health, including: Memory and ability to understand (cognition). Emotional well-being. Home life and relationship well-being. Sexual activity and sexual health. Lifestyle, including: Alcohol, nicotine or tobacco, and drug use. Access to firearms. Diet, exercise, and sleep habits. Work and work Statistician. Sunscreen use. Safety issues such as seatbelt and bike helmet use. Physical exam Your health care provider will check your: Height and weight. These may be used to calculate your BMI (body mass index). BMI is a measurement that tells if you are at a healthy weight. Waist circumference. This measures the distance around your waistline. This measurement also tells if you are at a healthy weight and may help predict your risk of certain diseases, such as type 2 diabetes and high blood pressure. Heart rate and blood pressure. Body temperature. Skin for abnormal spots. What immunizations do I need?  Vaccines are usually given at various ages, according to a schedule. Your health care provider will recommend vaccines for you based on your age, medical history, and lifestyle or other factors, such as travel or where you work. What tests do I need? Screening Your health care provider may recommend screening tests for certain conditions. This may include: Lipid and cholesterol levels. Hepatitis C test. Hepatitis B test. HIV (human immunodeficiency virus) test. STI (sexually transmitted  infection) testing, if you are at risk. Lung cancer screening. Colorectal cancer screening. Diabetes screening. This is done by checking your blood sugar (glucose) after you have not eaten for a while (fasting). Mammogram. Talk with your health care provider about how often you should have regular mammograms. BRCA-related cancer screening. This may be done if you have a family history of breast, ovarian, tubal, or peritoneal cancers. Bone density scan. This is done to screen for osteoporosis. Talk with your health care provider about your test results, treatment options, and if necessary, the need for more tests. Follow these instructions at home: Eating and drinking  Eat a diet that includes fresh fruits and vegetables, whole grains, lean protein, and low-fat dairy products. Limit your intake of foods with high amounts of sugar, saturated fats, and salt. Take vitamin and mineral supplements as recommended by your health care provider. Do not drink alcohol if your health care provider tells you not to drink. If you drink alcohol: Limit how much you have to 0-1 drink a day. Know how much alcohol is in your drink. In the [place] one drink equals one 12 oz bottle of beer (355 mL), one 5 oz glass of wine (148 mL), or one 1 oz glass of hard liquor (44 mL). Lifestyle Brush your teeth every morning and night with fluoride toothpaste. Floss one time each day. Exercise for at least 30 minutes 5 or more days each week. Do not use any products that contain nicotine or tobacco. These products include cigarettes, chewing tobacco, and vaping devices, such as e-cigarettes. If you need help quitting, ask your health care provider. Do not use drugs. If you are sexually active, practice safe sex. Use a condom or other form of protection in order to prevent STIs. Take  aspirin only as told by your health care provider. Make sure that you understand how much to take and what form to take. Work with your health care  provider to find out whether it is safe and beneficial for you to take aspirin daily. Ask your health care provider if you need to take a cholesterol-lowering medicine (statin). Find healthy ways to manage stress, such as: Meditation, yoga, or listening to music. Journaling. Talking to a trusted person. Spending time with friends and family. Minimize exposure to UV radiation to reduce your risk of skin cancer. Safety Always wear your seat belt while driving or riding in a vehicle. Do not drive: If you have been drinking alcohol. Do not ride with someone who has been drinking. When you are tired or distracted. While texting. If you have been using any mind-altering substances or drugs. Wear a helmet and other protective equipment during sports activities. If you have firearms in your house, make sure you follow all gun safety procedures. What's next? Visit your health care provider once a year for an annual wellness visit. Ask your health care provider how often you should have your eyes and teeth checked. Stay up to date on all vaccines. This information is not intended to replace advice given to you by your health care provider. Make sure you discuss any questions you have with your health care provider. Document Revised: 06/05/2021 Document Reviewed: 06/05/2021 Elsevier Patient Education  2023 Elsevier Inc.  

## 2022-06-07 LAB — LIPID PANEL
Chol/HDL Ratio: 2.6 ratio (ref 0.0–4.4)
Cholesterol, Total: 223 mg/dL — ABNORMAL HIGH (ref 100–199)
HDL: 85 mg/dL (ref >39)
LDL Chol Calc (NIH): 125 mg/dL — ABNORMAL HIGH (ref 0–99)
Triglycerides: 75 mg/dL (ref 0–149)
VLDL Cholesterol Cal: 13 mg/dL (ref 5–40)

## 2022-06-07 LAB — HEPATITIS C ANTIBODY: Hep C Virus Ab: NONREACTIVE

## 2022-06-07 LAB — COMPREHENSIVE METABOLIC PANEL
ALT: 30 IU/L (ref 0–32)
AST: 47 IU/L — ABNORMAL HIGH (ref 0–40)
Albumin/Globulin Ratio: 2.1 (ref 1.2–2.2)
Albumin: 4.1 g/dL (ref 3.8–4.8)
Alkaline Phosphatase: 58 IU/L (ref 44–121)
BUN/Creatinine Ratio: 22 (ref 12–28)
BUN: 15 mg/dL (ref 8–27)
Bilirubin Total: 1 mg/dL (ref 0.0–1.2)
CO2: 20 mmol/L (ref 20–29)
Calcium: 9.4 mg/dL (ref 8.7–10.3)
Chloride: 105 mmol/L (ref 96–106)
Creatinine, Ser: 0.67 mg/dL (ref 0.57–1.00)
Globulin, Total: 2 g/dL (ref 1.5–4.5)
Glucose: 101 mg/dL — ABNORMAL HIGH (ref 70–99)
Potassium: 4.3 mmol/L (ref 3.5–5.2)
Sodium: 140 mmol/L (ref 134–144)
Total Protein: 6.1 g/dL (ref 6.0–8.5)
eGFR: 96 mL/min/{1.73_m2} (ref >59)

## 2022-06-07 LAB — CBC
Hematocrit: 40.5 % (ref 34.0–46.6)
Hemoglobin: 13.6 g/dL (ref 11.1–15.9)
MCH: 33.9 pg — ABNORMAL HIGH (ref 26.6–33.0)
MCHC: 33.6 g/dL (ref 31.5–35.7)
MCV: 101 fL — ABNORMAL HIGH (ref 79–97)
Platelets: 196 10*3/uL (ref 150–450)
RBC: 4.01 x10E6/uL (ref 3.77–5.28)
RDW: 12.2 % (ref 11.7–15.4)
WBC: 6.6 10*3/uL (ref 3.4–10.8)

## 2022-06-07 LAB — HEMOGLOBIN A1C
Est. average glucose Bld gHb Est-mCnc: 123 mg/dL
Hgb A1c MFr Bld: 5.9 % — ABNORMAL HIGH (ref 4.8–5.6)

## 2022-06-07 LAB — TSH: TSH: 2.39 u[IU]/mL (ref 0.450–4.500)

## 2022-07-09 ENCOUNTER — Ambulatory Visit: Payer: Medicare HMO | Admitting: Neurology

## 2022-07-09 ENCOUNTER — Encounter: Payer: Self-pay | Admitting: Neurology

## 2022-07-09 VITALS — BP 166/100 | HR 130 | Ht 64.0 in | Wt 181.0 lb

## 2022-07-09 DIAGNOSIS — E785 Hyperlipidemia, unspecified: Secondary | ICD-10-CM | POA: Diagnosis not present

## 2022-07-09 DIAGNOSIS — G3184 Mild cognitive impairment, so stated: Secondary | ICD-10-CM | POA: Diagnosis not present

## 2022-07-09 MED ORDER — ROSUVASTATIN CALCIUM 40 MG PO TABS: 40.0000 mg | ORAL_TABLET | Freq: Every day | ORAL | 3 refills | Status: AC

## 2022-07-09 NOTE — Progress Notes (Signed)
[facility]  PATIENT: Mackenzie Dudley DOB: [birth year]  REQUESTING CLINICIAN: Mayer Masker, PA-C HISTORY FROM: Patient  REASON FOR VISIT: Memory decline    HISTORICAL  CHIEF COMPLAINT:  Chief Complaint  Patient presents with   New Patient (Initial Visit)    Rm 12,  States her memory is fine, pcp wanted her evaluated, pt has no concerns      HISTORY OF PRESENT ILLNESS:  This is a six 66-year-old woman past medical history of hyperlipidemia who is presenting was referred by her primary care doctor for memory problem  Patient reported she felt like she does not have any issue with her memory.  She lives alone, she is independent, she still works, drives and does not have any difficulty.  She said at times she may be forgetful but otherwise does not affect.   TBI:   No past history of TBI Stroke:   no past history of stroke Seizures:  no past history of seizures Sleep:  no history of sleep apnea.  Mood:    patient denies anxiety and depression  Functional status: independent in all ADLs and IADLs Patient lives alone. Cooking: Patient no acute issues noted Cleaning: Patient Shopping: Patient Bathing: Patient Toileting: Patient Driving: Patient, denies any recent accident or being lost in familiar places Bills: Patient, denies being late on bills  Ever left the stove on by accident?:  Denies Forget how to use items around the house?:  Denies Getting lost going to familiar places?:  Denies Forgetting loved ones names?:  Denies Word finding difficulty?  Denies Sleep: States her sleep is normal.  OTHER MEDICAL CONDITIONS: Hyperlipidemia    REVIEW OF SYSTEMS: Full 14 system review of systems performed and negative with exception of: As noted in the HPI  ALLERGIES: Allergies  Allergen Reactions   Atorvastatin Calcium Other (See Comments)   Pravastatin Sodium Other (See Comments)    HOME MEDICATIONS: Outpatient Medications Prior to Visit   Medication Sig Dispense Refill   diphenhydrAMINE (BENADRYL) 25 MG tablet Take 25 mg by mouth every 6 (six) hours as needed.     hydrocortisone cream 1 % Apply 1 application. topically 2 (two) times daily.     ibuprofen (ADVIL) 800 MG tablet Take 800 mg by mouth 3 (three) times daily as needed.     methocarbamol (ROBAXIN) 500 MG tablet Take 500 mg by mouth 3 (three) times daily.     naproxen sodium (ANAPROX) 220 MG tablet Take 220 mg by mouth 2 (two) times daily with a meal.     oxybutynin (DITROPAN) 5 MG tablet Take 1 tablet (5 mg total) by mouth 2 (two) times daily. 60 tablet 1   Propylene Glycol (SYSTANE BALANCE) 0.6 % SOLN Apply to eye.     Ruxolitinib Phosphate (OPZELURA) 1.5 % CREA Apply topically. Apply to affected area     tiZANidine (ZANAFLEX) 4 MG tablet Take 1 tablet (4 mg total) by mouth every 8 (eight) hours as needed for muscle spasms. 30 tablet 0   rosuvastatin (CRESTOR) 20 MG tablet Take 1 tablet (20 mg total) by mouth daily. 90 tablet 0   No facility-administered medications prior to visit.    PAST MEDICAL HISTORY: Past Medical History:  Diagnosis Date   High cholesterol     PAST SURGICAL HISTORY: History reviewed. No pertinent surgical history.  FAMILY HISTORY: History reviewed. No pertinent family history.  SOCIAL HISTORY: Social History   Socioeconomic History   Marital status: Widowed    Spouse name:  Not on file   Number of children: 4   Years of education: Not on file   Highest education level: GED or equivalent  Occupational History   Occupation: Retired  Tobacco Use   Smoking status: Former    Packs/day: 0.50    Types: Cigarettes    Start date: 60    Quit date: 01/22/2022    Years since quitting: 0.4   Smokeless tobacco: Never  Vaping Use   Vaping Use: Never used  Substance and Sexual Activity   Alcohol use: Yes    Alcohol/week: 1.0 standard drink of alcohol    Types: 1 Cans of beer per week    Comment: 1 beer/ day   Drug use: No   Sexual  activity: Not Currently  Other Topics Concern   Not on file  Social History Narrative   Not on file   Social Determinants of Health   Financial Resource Strain: Low Risk  (06/05/2022)   Overall Financial Resource Strain (CARDIA)    Difficulty of Paying Living Expenses: Not hard at all  Food Insecurity: No Food Insecurity (06/05/2022)   Hunger Vital Sign    Worried About Running Out of Food in the Last Year: Never true    Ran Out of Food in the Last Year: Never true  Transportation Needs: No Transportation Needs (06/05/2022)   PRAPARE - Administrator, Civil Service (Medical): No    Lack of Transportation (Non-Medical): No  Physical Activity: Inactive (06/05/2022)   Exercise Vital Sign    Days of Exercise per Week: 0 days    Minutes of Exercise per Session: 0 min  Stress: No Stress Concern Present (06/05/2022)   [employer] of Occupational Health - Occupational Stress Questionnaire    Feeling of Stress : Not at all  Social Connections: Socially Isolated (06/05/2022)   Social Connection and Isolation Panel [NHANES]    Frequency of Communication with Friends and Family: More than three times a week    Frequency of Social Gatherings with Friends and Family: More than three times a week    Attends Religious Services: Never    Database administrator or Organizations: No    Attends Banker Meetings: Never    Marital Status: Widowed  Intimate Partner Violence: Not At Risk (06/05/2022)   Humiliation, Afraid, Rape, and Kick questionnaire    Fear of Current or Ex-Partner: No    Emotionally Abused: No    Physically Abused: No    Sexually Abused: No    PHYSICAL EXAM  GENERAL EXAM/CONSTITUTIONAL: Vitals:  Vitals:   07/09/22 1331  BP: (!) 166/100  Pulse: (!) 130  Weight: 181 lb (82.1 kg)  Height: 5\' 4"  (1.626 m)   Body mass index is 31.07 kg/m. Wt Readings from Last 3 Encounters:  07/09/22 181 lb (82.1 kg)  06/05/22 181 lb (82.1 kg)  04/30/22 179 lb  (81.2 kg)   Patient is in no distress; well developed, nourished and groomed; neck is supple  EYES: Pupils round and reactive to light, Visual fields full to confrontation, Extraocular movements intacts,   MUSCULOSKELETAL: Gait, strength, tone, movements noted in Neurologic exam below  NEUROLOGIC: MENTAL STATUS:      No data to display            07/09/2022    1:33 PM  Montreal Cognitive Assessment   Visuospatial/ Executive (0/5) 4  Naming (0/3) 3  Attention: Read list of digits (0/2) 2  Attention: Read list of letters (  0/1) 1  Attention: Serial 7 subtraction starting at 100 (0/3) 0  Language: Repeat phrase (0/2) 1  Language : Fluency (0/1) 1  Abstraction (0/2) 2  Delayed Recall (0/5) 0  Orientation (0/6) 6  Total 20     CRANIAL NERVE:  2nd, 3rd, 4th, 6th - pupils equal and reactive to light, visual fields full to confrontation, extraocular muscles intact, no nystagmus 5th - facial sensation symmetric 7th - facial strength symmetric 8th - hearing intact 9th - palate elevates symmetrically, uvula midline 11th - shoulder shrug symmetric 12th - tongue protrusion midline  MOTOR:  normal bulk and tone, full strength in the BUE, BLE  SENSORY:  normal and symmetric to light touch  COORDINATION:  finger-nose-finger, fine finger movements normal  REFLEXES:  deep tendon reflexes present and symmetric  GAIT/STATION:  normal   DIAGNOSTIC DATA (LABS, IMAGING, TESTING) - I reviewed patient records, labs, notes, testing and imaging myself where available.  Lab Results  Component Value Date   WBC 6.6 06/05/2022   HGB 13.6 06/05/2022   HCT 40.5 06/05/2022   MCV 101 (H) 06/05/2022   PLT 196 06/05/2022      Component Value Date/Time   NA 140 06/05/2022 0951   K 4.3 06/05/2022 0951   CL 105 06/05/2022 0951   CO2 20 06/05/2022 0951   GLUCOSE 101 (H) 06/05/2022 0951   GLUCOSE 108 (H) 01/23/2014 0940   BUN 15 06/05/2022 0951   CREATININE 0.67 06/05/2022 0951    CALCIUM 9.4 06/05/2022 0951   PROT 6.1 06/05/2022 0951   ALBUMIN 4.1 06/05/2022 0951   AST 47 (H) 06/05/2022 0951   ALT 30 06/05/2022 0951   ALKPHOS 58 06/05/2022 0951   BILITOT 1.0 06/05/2022 0951   GFRNONAA >90 01/23/2014 0940   GFRAA >90 01/23/2014 0940   Lab Results  Component Value Date   CHOL 223 (H) 06/05/2022   HDL 85 06/05/2022   LDLCALC 125 (H) 06/05/2022   TRIG 75 06/05/2022   CHOLHDL 2.6 06/05/2022   Lab Results  Component Value Date   HGBA1C 5.9 (H) 06/05/2022   No results found for: "VITAMINB12" Lab Results  Component Value Date   TSH 2.390 06/05/2022     ASSESSMENT AND PLAN  67 y.o. year old female with past medical history of hyperlipidemia who is presenting for memory problem.  Patient reports that currently she does not have any issue with her memory.  She still works, lives independently and is independent in all ADLs and IADLs.  On exam today, she scored a 20 out of 30 on the [place] indicative of mild impairment but otherwise she still functioning independently.  At this time I informed patient that she likely has mild cognitive impairment, discussed ways to reduce the risk of dementia including exercise, keeping a good health, good diet and adequate sleep.  She voices understanding.  Continue to follow with the PCP.  Her labs shows her LDL of 125, will increase her Crestor to 40.   1. Mild cognitive impairment   2. Hyperlipidemia, unspecified hyperlipidemia type      Patient Instructions  Increase Crestor to 40 mg and take it nightly  Continue your other medications  Return as needed   No orders of the defined types were placed in this encounter.   Meds ordered this encounter  Medications   rosuvastatin (CRESTOR) 40 MG tablet    Sig: Take 1 tablet (40 mg total) by mouth daily.    Dispense:  90 tablet  Refill:  3    Return if symptoms worsen or fail to improve.  I have spent a total of 50 minutes dedicated to this patient today,  preparing to see patient, performing a medically appropriate examination and evaluation, ordering tests and/or medications and procedures, and counseling and educating the patient/family/caregiver; independently interpreting result and communicating results to the family/patient/caregiver; and documenting clinical information in the electronic medical record.   Windell Norfolk, MD 07/09/2022, 4:49 PM  [facility] [address], [address] [place], [place] [address] [phone]

## 2022-07-09 NOTE — Patient Instructions (Signed)
Increase Crestor to 40 mg and take it nightly  Continue your other medications  Return as needed

## 2022-07-17 ENCOUNTER — Other Ambulatory Visit: Payer: Self-pay | Admitting: Physician Assistant

## 2022-07-17 DIAGNOSIS — N3281 Overactive bladder: Secondary | ICD-10-CM

## 2022-08-29 DIAGNOSIS — R69 Illness, unspecified: Secondary | ICD-10-CM | POA: Diagnosis not present

## 2022-09-08 DIAGNOSIS — H6121 Impacted cerumen, right ear: Secondary | ICD-10-CM | POA: Diagnosis not present

## 2022-09-08 DIAGNOSIS — R432 Parageusia: Secondary | ICD-10-CM | POA: Diagnosis not present

## 2022-09-15 ENCOUNTER — Other Ambulatory Visit: Payer: Self-pay | Admitting: Physician Assistant

## 2022-09-15 DIAGNOSIS — E785 Hyperlipidemia, unspecified: Secondary | ICD-10-CM

## 2022-10-15 ENCOUNTER — Other Ambulatory Visit: Payer: Self-pay | Admitting: Physician Assistant

## 2022-10-15 DIAGNOSIS — N3281 Overactive bladder: Secondary | ICD-10-CM

## 2023-02-18 ENCOUNTER — Other Ambulatory Visit: Payer: Self-pay | Admitting: Nurse Practitioner

## 2023-02-18 ENCOUNTER — Ambulatory Visit (HOSPITAL_COMMUNITY)
Admission: RE | Admit: 2023-02-18 | Discharge: 2023-02-18 | Disposition: A | Discharge status: Home / Self Care | Payer: Medicare HMO | Source: Ambulatory Visit | Attending: Vascular Surgery | Admitting: Vascular Surgery

## 2023-02-18 ENCOUNTER — Other Ambulatory Visit (HOSPITAL_COMMUNITY): Payer: Self-pay | Admitting: Nurse Practitioner

## 2023-02-18 DIAGNOSIS — R7401 Elevation of levels of liver transaminase levels: Secondary | ICD-10-CM

## 2023-02-18 DIAGNOSIS — I739 Peripheral vascular disease, unspecified: Secondary | ICD-10-CM

## 2023-02-19 LAB — VAS US ABI WITH/WO TBI
Left ABI: 1.08
Right ABI: 0.78

## 2023-04-23 ENCOUNTER — Other Ambulatory Visit: Payer: Self-pay | Admitting: Nurse Practitioner

## 2023-04-23 DIAGNOSIS — Z1231 Encounter for screening mammogram for malignant neoplasm of breast: Secondary | ICD-10-CM

## 2023-04-24 ENCOUNTER — Ambulatory Visit
Admission: RE | Admit: 2023-04-24 | Discharge: 2023-04-24 | Disposition: A | Discharge status: Home / Self Care | Payer: Medicare HMO | Source: Ambulatory Visit | Attending: Nurse Practitioner | Admitting: Nurse Practitioner

## 2023-04-24 DIAGNOSIS — R7401 Elevation of levels of liver transaminase levels: Secondary | ICD-10-CM

## 2023-05-12 ENCOUNTER — Other Ambulatory Visit (HOSPITAL_BASED_OUTPATIENT_CLINIC_OR_DEPARTMENT_OTHER): Payer: Self-pay | Admitting: Nurse Practitioner

## 2023-05-12 DIAGNOSIS — M25552 Pain in left hip: Secondary | ICD-10-CM

## 2023-05-27 ENCOUNTER — Ambulatory Visit
Admission: RE | Admit: 2023-05-27 | Discharge: 2023-05-27 | Disposition: A | Discharge status: Home / Self Care | Payer: Medicare HMO | Source: Ambulatory Visit | Attending: Nurse Practitioner | Admitting: Nurse Practitioner

## 2023-05-27 DIAGNOSIS — Z1231 Encounter for screening mammogram for malignant neoplasm of breast: Secondary | ICD-10-CM

## 2023-05-27 DIAGNOSIS — M25552 Pain in left hip: Secondary | ICD-10-CM

## 2023-05-28 ENCOUNTER — Telehealth: Payer: Self-pay

## 2023-05-28 NOTE — Telephone Encounter (Signed)
Contacted patient on preferred number listed in notes for scheduled AWV. Patient stated no longer a patient with provider.

## 2023-07-28 ENCOUNTER — Encounter: Payer: Self-pay | Admitting: Podiatry

## 2023-07-28 ENCOUNTER — Ambulatory Visit: Payer: Medicare HMO | Admitting: Podiatry

## 2023-07-28 VITALS — BP 184/102

## 2023-07-28 DIAGNOSIS — B351 Tinea unguium: Secondary | ICD-10-CM

## 2023-07-28 DIAGNOSIS — L989 Disorder of the skin and subcutaneous tissue, unspecified: Secondary | ICD-10-CM

## 2023-07-28 DIAGNOSIS — Q828 Other specified congenital malformations of skin: Secondary | ICD-10-CM | POA: Diagnosis not present

## 2023-07-28 DIAGNOSIS — M79675 Pain in left toe(s): Secondary | ICD-10-CM

## 2023-07-28 DIAGNOSIS — M79674 Pain in right toe(s): Secondary | ICD-10-CM

## 2023-07-28 NOTE — Progress Notes (Signed)
This patient presents to the office with chief complaint of long thick painful nails and painful callus both feet.  Patient says the nails  and callus are painful walking and wearing shoes.  This patient is unable to self treat.  This patient is unable to trim her nails since she is unable to reach her nails.  She presents to the office for preventative foot care services.  General Appearance  Alert, conversant and in no acute stress.  Vascular  Dorsalis pedis and posterior tibial  pulses are palpable  bilaterally.  Capillary return is within normal limits  bilaterally. Temperature is within normal limits  bilaterally.  Neurologic  Senn-Weinstein monofilament wire test within normal limits  bilaterally. Muscle power within normal limits bilaterally.  Nails Thick disfigured discolored nails with subungual debris  from hallux to fifth toes bilaterally. No evidence of bacterial infection or drainage bilaterally.  Orthopedic  No limitations of motion  feet .  No crepitus or effusions noted.  No bony pathology or digital deformities noted. Plantar flexed metatarsals  B/L.  Skin  normotropic skin with no porokeratosis noted bilaterally.  No signs of infections or ulcers noted.  Porokeratosis sub metatarsals  B/L.   Onychomycosis  Nails  B/L.  Pain in right toes  Pain in left toes  Porokeratosis  B/L.  Debridement of nails both feet followed trimming the nails with dremel tool.   Debridement of callus  B/L with # 15 blade. RTC 10 weeks   Helane Gunther DPM

## 2023-08-10 ENCOUNTER — Other Ambulatory Visit: Payer: Self-pay | Admitting: Nurse Practitioner

## 2023-08-10 DIAGNOSIS — K7469 Other cirrhosis of liver: Secondary | ICD-10-CM

## 2023-08-10 DIAGNOSIS — R748 Abnormal levels of other serum enzymes: Secondary | ICD-10-CM

## 2023-08-25 ENCOUNTER — Other Ambulatory Visit: Payer: Medicare HMO

## 2023-09-01 ENCOUNTER — Other Ambulatory Visit: Payer: Medicare HMO

## 2023-10-05 ENCOUNTER — Ambulatory Visit
Admission: RE | Admit: 2023-10-05 | Discharge: 2023-10-05 | Disposition: A | Discharge status: Home / Self Care | Payer: Medicare HMO | Source: Ambulatory Visit | Attending: Nurse Practitioner

## 2023-10-05 DIAGNOSIS — K7469 Other cirrhosis of liver: Secondary | ICD-10-CM

## 2023-10-05 DIAGNOSIS — R748 Abnormal levels of other serum enzymes: Secondary | ICD-10-CM

## 2023-10-06 ENCOUNTER — Ambulatory Visit: Payer: Medicare HMO | Admitting: Podiatry

## 2023-10-06 ENCOUNTER — Encounter: Payer: Self-pay | Admitting: Podiatry

## 2023-10-06 DIAGNOSIS — B351 Tinea unguium: Secondary | ICD-10-CM | POA: Diagnosis not present

## 2023-10-06 DIAGNOSIS — M79674 Pain in right toe(s): Secondary | ICD-10-CM

## 2023-10-06 DIAGNOSIS — M79675 Pain in left toe(s): Secondary | ICD-10-CM | POA: Diagnosis not present

## 2023-10-06 DIAGNOSIS — Q828 Other specified congenital malformations of skin: Secondary | ICD-10-CM | POA: Diagnosis not present

## 2023-10-06 DIAGNOSIS — L989 Disorder of the skin and subcutaneous tissue, unspecified: Secondary | ICD-10-CM

## 2023-10-06 NOTE — Progress Notes (Signed)
This patient presents to the office with chief complaint of long thick painful nails and painful callus both feet.  Patient says the nails  and callus are painful walking and wearing shoes.  This patient is unable to self treat.  This patient is unable to trim her nails since she is unable to reach her nails.  She presents to the office for preventative foot care services.  General Appearance  Alert, conversant and in no acute stress.  Vascular  Dorsalis pedis and posterior tibial  pulses are palpable  bilaterally.  Capillary return is within normal limits  bilaterally. Temperature is within normal limits  bilaterally.  Neurologic  Senn-Weinstein monofilament wire test within normal limits  bilaterally. Muscle power within normal limits bilaterally.  Nails Thick disfigured discolored nails with subungual debris  from hallux to fifth toes bilaterally. No evidence of bacterial infection or drainage bilaterally.  Orthopedic  No limitations of motion  feet .  No crepitus or effusions noted.  No bony pathology or digital deformities noted. Plantar flexed metatarsals  B/L.  Skin  normotropic skin with no porokeratosis noted bilaterally.  No signs of infections or ulcers noted.  Porokeratosis sub metatarsals  B/L.   Onychomycosis  Nails  B/L.  Pain in right toes  Pain in left toes  Porokeratosis  B/L.  Debridement of nails both feet followed trimming the nails with dremel tool.   Debridement of callus  B/L with # 15 blade.  Told her to have a follow up appointment with Dr.  Allena Katz.  Surgical consult.   RTC 10 weeks   Helane Gunther DPM

## 2023-10-28 ENCOUNTER — Encounter: Payer: Self-pay | Admitting: Podiatry

## 2023-10-28 ENCOUNTER — Ambulatory Visit: Payer: Medicare HMO | Admitting: Podiatry

## 2023-10-28 DIAGNOSIS — Z01818 Encounter for other preprocedural examination: Secondary | ICD-10-CM

## 2023-10-28 DIAGNOSIS — M216X2 Other acquired deformities of left foot: Secondary | ICD-10-CM

## 2023-10-28 DIAGNOSIS — M216X1 Other acquired deformities of right foot: Secondary | ICD-10-CM | POA: Diagnosis not present

## 2023-10-28 NOTE — Progress Notes (Signed)
Subjective:  Patient ID: Mackenzie Dudley, female    DOB: [birth year],  MRN: [id number]  Chief Complaint  Patient presents with   Callouses    PATIENT STATES THAT THE CALLOUSES IS BACK AT THE BOTTOM OF HER BILATERAL FEET . PATIENT TAKES TYLENOL FOR PAIN AND NUMMING CREAM FOR PAIN    67 y.o. female presents with the above complaint.  Patient presents with bilateral third metatarsal plantarflexed.  She would like to discuss treatment options for which she has been dealing with this pain for quite some time debridement of the callus has only brought her some temporary relief but is causing her a lot of pain she has failed all conservative care including shoe gear modification padding protecting debridement she would like to discuss surgical options at this time.   Review of Systems: Negative except as noted in the HPI. Denies N/V/F/Ch.  Past Medical History:  Diagnosis Date   High cholesterol     Current Outpatient Medications:    diphenhydrAMINE (BENADRYL) 25 MG tablet, Take 25 mg by mouth every 6 (six) hours as needed., Disp: , Rfl:    hydrocortisone cream 1 %, Apply 1 application. topically 2 (two) times daily., Disp: , Rfl:    ibuprofen (ADVIL) 800 MG tablet, Take 800 mg by mouth 3 (three) times daily as needed., Disp: , Rfl:    methocarbamol (ROBAXIN) 500 MG tablet, Take 500 mg by mouth 3 (three) times daily., Disp: , Rfl:    naproxen sodium (ANAPROX) 220 MG tablet, Take 220 mg by mouth 2 (two) times daily with a meal., Disp: , Rfl:    oxybutynin (DITROPAN) 5 MG tablet, TAKE 1 TABLET(5 MG) BY MOUTH TWICE DAILY, Disp: 60 tablet, Rfl: 1   Propylene Glycol (SYSTANE BALANCE) 0.6 % SOLN, Apply to eye., Disp: , Rfl:    Ruxolitinib Phosphate (OPZELURA) 1.5 % CREA, Apply topically. Apply to affected area, Disp: , Rfl:    tiZANidine (ZANAFLEX) 4 MG tablet, Take 1 tablet (4 mg total) by mouth every 8 (eight) hours as needed for muscle spasms., Disp: 30 tablet, Rfl: 0   rosuvastatin (CRESTOR) 40 MG  tablet, Take 1 tablet (40 mg total) by mouth daily., Disp: 90 tablet, Rfl: 3  Social History   Tobacco Use  Smoking Status Former   Current packs/day: 0.00   Average packs/day: 0.5 packs/day for 36.1 years (18.0 ttl pk-yrs)   Types: Cigarettes   Start date: 69   Quit date: 01/22/2022   Years since quitting: 1.7  Smokeless Tobacco Never    Allergies  Allergen Reactions   Atorvastatin Calcium Other (See Comments)   Pravastatin Sodium Other (See Comments)   Objective:  There were no vitals filed for this visit. There is no height or weight on file to calculate BMI. Constitutional Well developed. Well nourished.  Vascular Dorsalis pedis pulses palpable bilaterally. Posterior tibial pulses palpable bilaterally. Capillary refill normal to all digits.  No cyanosis or clubbing noted. Pedal hair growth normal.  Neurologic Normal speech. Oriented to person, place, and time. Epicritic sensation to light touch grossly present bilaterally.  Dermatologic Nails well groomed and normal in appearance. No open wounds. No skin lesions.  Orthopedic: Bilateral plantarflexed third metatarsal noted with submetatarsal 3 pain   Radiographs: Bilateral foot: None Assessment:   1. 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 Plantar flexed metatarsal bone of left foot   3. Encounter for preoperative examination for general surgical procedure    Plan:  Patient was evaluated and  treated and all questions answered.  Bilateral plantarflexed third metatarsal -All questions and concerns were discussed with the patient in extensive detail given the amount of pain that she is experiencing in the setting of failing all conservative care she would benefit from surgical intervention with bilateral third metatarsal floating osteotomy.  I discussed my preoperative intra or postoperative plan with the patient in extensive detail she states understanding like to proceed with surgery -Informed surgical  risk consent was reviewed and read aloud to the patient.  I reviewed the films.  I have discussed my findings with the patient in great detail.  I have discussed all risks including but not limited to infection, stiffness, scarring, limp, disability, deformity, damage to blood vessels and nerves, numbness, poor healing, need for braces, arthritis, chronic pain, amputation, death.  All benefits and realistic expectations discussed in great detail.  I have made no promises as to the outcome.  I have provided realistic expectations.  I have offered the patient a 2nd opinion, which they have declined and assured me they preferred to proceed despite the risks   No follow-ups on file.

## 2023-11-25 ENCOUNTER — Telehealth: Payer: Self-pay | Admitting: Podiatry

## 2023-11-25 NOTE — Telephone Encounter (Signed)
Per the peer to peer, the authorization request was withdrawn and needs to be resubmitted once an additional appointment is completed along with xrays. The pt needs to be scheduled to come back in see Dr.Patel as soon as possible for the xrays, so that a new auth request can be submitted and she can still keep her 12/16 surgery date.

## 2023-11-27 ENCOUNTER — Ambulatory Visit: Payer: Medicare HMO | Admitting: Podiatry

## 2023-11-27 ENCOUNTER — Encounter: Payer: Self-pay | Admitting: Podiatry

## 2023-11-27 ENCOUNTER — Ambulatory Visit (INDEPENDENT_AMBULATORY_CARE_PROVIDER_SITE_OTHER): Payer: Medicare HMO

## 2023-11-27 VITALS — Ht 64.0 in | Wt 181.0 lb

## 2023-11-27 DIAGNOSIS — M216X2 Other acquired deformities of left foot: Secondary | ICD-10-CM

## 2023-11-27 DIAGNOSIS — M216X1 Other acquired deformities of right foot: Secondary | ICD-10-CM | POA: Diagnosis not present

## 2023-11-27 DIAGNOSIS — Z01818 Encounter for other preprocedural examination: Secondary | ICD-10-CM | POA: Diagnosis not present

## 2023-11-27 NOTE — Progress Notes (Signed)
Subjective:  Patient ID: Mackenzie Dudley, female    DOB: 1956-11-28,  MRN: [id number]  Chief Complaint  Patient presents with   Foot Pain    Patient is here for xrays for surgery approval/ consult    67 y.o. female presents with the above complaint.  Patient present for follow-up of bilateral third plantarflexed metatarsal.  She would like to not focus on the right side.  She is here to get her x-rays she denies any other acute complaints   Review of Systems: Negative except as noted in the HPI. Denies N/V/F/Ch.  Past Medical History:  Diagnosis Date   High cholesterol     Current Outpatient Medications:    diphenhydrAMINE (BENADRYL) 25 MG tablet, Take 25 mg by mouth every 6 (six) hours as needed., Disp: , Rfl:    hydrocortisone cream 1 %, Apply 1 application. topically 2 (two) times daily., Disp: , Rfl:    ibuprofen (ADVIL) 800 MG tablet, Take 800 mg by mouth 3 (three) times daily as needed., Disp: , Rfl:    methocarbamol (ROBAXIN) 500 MG tablet, Take 500 mg by mouth 3 (three) times daily., Disp: , Rfl:    naproxen sodium (ANAPROX) 220 MG tablet, Take 220 mg by mouth 2 (two) times daily with a meal., Disp: , Rfl:    oxybutynin (DITROPAN) 5 MG tablet, TAKE 1 TABLET(5 MG) BY MOUTH TWICE DAILY, Disp: 60 tablet, Rfl: 1   Propylene Glycol (SYSTANE BALANCE) 0.6 % SOLN, Apply to eye., Disp: , Rfl:    Ruxolitinib Phosphate (OPZELURA) 1.5 % CREA, Apply topically. Apply to affected area, Disp: , Rfl:    tiZANidine (ZANAFLEX) 4 MG tablet, Take 1 tablet (4 mg total) by mouth every 8 (eight) hours as needed for muscle spasms., Disp: 30 tablet, Rfl: 0   rosuvastatin (CRESTOR) 40 MG tablet, Take 1 tablet (40 mg total) by mouth daily., Disp: 90 tablet, Rfl: 3  Social History   Tobacco Use  Smoking Status Former   Current packs/day: 0.00   Average packs/day: 0.5 packs/day for 36.1 years (18.0 ttl pk-yrs)   Types: Cigarettes   Start date: 21   Quit date: 01/22/2022   Years since quitting: 1.8   Smokeless Tobacco Never    Allergies  Allergen Reactions   Atorvastatin Calcium Other (See Comments)   Pravastatin Sodium Other (See Comments)   Objective:  There were no vitals filed for this visit. Body mass index is 31.07 kg/m. Constitutional Well developed. Well nourished.  Vascular Dorsalis pedis pulses palpable bilaterally. Posterior tibial pulses palpable bilaterally. Capillary refill normal to all digits.  No cyanosis or clubbing noted. Pedal hair growth normal.  Neurologic Normal speech. Oriented to person, place, and time. Epicritic sensation to light touch grossly present bilaterally.  Dermatologic Nails well groomed and normal in appearance. No open wounds. No skin lesions.  Orthopedic: Bilateral plantarflexed third metatarsal noted with submetatarsal 3 pain with right being the primarily painful 1   Radiographs: 3 views of skeletally mature adult right foot: Plantarflexed third metatarsal noted.  Negative declination of the metatarsal noted. Assessment:   1. 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 Encounter for preoperative examination for general surgical procedure    Plan:  Patient was evaluated and treated and all questions answered.  Right plantarflexed third metatarsal -All questions and concerns were discussed with the patient in extensive detail given the amount of pain that she is experiencing in the setting of failing all conservative care she would benefit from  surgical intervention with bilateral third metatarsal floating osteotomy.  I discussed my preoperative intra or postoperative plan with the patient in extensive detail she states understanding like to proceed with surgery -Informed surgical risk consent was reviewed and read aloud to the patient.  I reviewed the films.  I have discussed my findings with the patient in great detail.  I have discussed all risks including but not limited to infection, stiffness, scarring, limp, disability,  deformity, damage to blood vessels and nerves, numbness, poor healing, need for braces, arthritis, chronic pain, amputation, death.  All benefits and realistic expectations discussed in great detail.  I have made no promises as to the outcome.  I have provided realistic expectations.  I have offered the patient a 2nd opinion, which they have declined and assured me they preferred to proceed despite the risks   No follow-ups on file.

## 2023-12-04 ENCOUNTER — Telehealth: Payer: Self-pay | Admitting: Podiatry

## 2023-12-04 NOTE — Telephone Encounter (Signed)
DOS- 12/07/23  METATARSAL OSTEOTOMY 3RD RT- 16109  [employer] EFFECTIVE DATE- 12/22/22  OOP-$8850.00 WITH REMAINING $8,680.00  COINSURANCE- 0%  PER THE [facility] WEBSITE PORTAL, PRIOR AUTH HAS BEEN APPROVED FOR CPT CODE 60454. GOOD FROM 12/07/23 - 12/22/23   Authorization #: [id number]  Tracking #[id number]

## 2023-12-07 ENCOUNTER — Other Ambulatory Visit: Payer: Self-pay | Admitting: Podiatry

## 2023-12-07 ENCOUNTER — Telehealth: Payer: Self-pay

## 2023-12-07 MED ORDER — OXYCODONE-ACETAMINOPHEN 5-325 MG PO TABS: 1.0000 | ORAL_TABLET | ORAL | 0 refills | Status: AC | PRN

## 2023-12-07 MED ORDER — IBUPROFEN 800 MG PO TABS: 800.0000 mg | ORAL_TABLET | Freq: Four times a day (QID) | ORAL | 1 refills | Status: AC | PRN

## 2023-12-07 NOTE — Telephone Encounter (Signed)
Patient called - the prescription went to the wrong pharmacy - she wants it to go to [facility] on [address] -thanks

## 2023-12-08 MED ORDER — IBUPROFEN 800 MG PO TABS: 800.0000 mg | ORAL_TABLET | Freq: Four times a day (QID) | ORAL | 1 refills | Status: AC | PRN

## 2023-12-08 MED ORDER — OXYCODONE-ACETAMINOPHEN 5-325 MG PO TABS: 1.0000 | ORAL_TABLET | ORAL | 0 refills | Status: AC | PRN

## 2023-12-08 NOTE — Addendum Note (Signed)
Addended by: Nicholes Rough on: 12/08/2023 09:43 AM   Modules accepted: Orders

## 2023-12-14 ENCOUNTER — Encounter: Payer: Medicare HMO | Admitting: Podiatry

## 2023-12-28 ENCOUNTER — Other Ambulatory Visit (HOSPITAL_COMMUNITY): Payer: Self-pay | Admitting: Cardiology

## 2023-12-28 DIAGNOSIS — I10 Essential (primary) hypertension: Secondary | ICD-10-CM

## 2023-12-28 DIAGNOSIS — I482 Chronic atrial fibrillation, unspecified: Secondary | ICD-10-CM

## 2023-12-30 ENCOUNTER — Encounter: Payer: Medicare HMO | Admitting: Podiatry

## 2024-01-04 ENCOUNTER — Ambulatory Visit (HOSPITAL_COMMUNITY)
Admission: RE | Admit: 2024-01-04 | Discharge: 2024-01-04 | Disposition: A | Discharge status: Home / Self Care | Payer: 59 | Source: Ambulatory Visit | Attending: Cardiology | Admitting: Cardiology

## 2024-01-04 DIAGNOSIS — I10 Essential (primary) hypertension: Secondary | ICD-10-CM | POA: Insufficient documentation

## 2024-01-04 DIAGNOSIS — I482 Chronic atrial fibrillation, unspecified: Secondary | ICD-10-CM

## 2024-01-04 DIAGNOSIS — I4891 Unspecified atrial fibrillation: Secondary | ICD-10-CM | POA: Insufficient documentation

## 2024-01-05 LAB — ECHOCARDIOGRAM COMPLETE
Area-P 1/2: 3.9 cm2
S' Lateral: 2.6 cm

## 2024-01-06 ENCOUNTER — Ambulatory Visit: Payer: Medicare HMO | Admitting: Podiatry

## 2024-02-01 IMAGING — MG MM DIGITAL SCREENING BILAT W/ TOMO AND CAD
8 series · 8 of 24 positions shown · non-contrast
Comparison: Previous exam(s).

CLINICAL DATA: Screening.

EXAM:
DIGITAL SCREENING BILATERAL MAMMOGRAM WITH TOMOSYNTHESIS AND CAD
TECHNIQUE: Bilateral screening digital craniocaudal and mediolateral oblique
mammograms were obtained. Bilateral screening digital breast
tomosynthesis was performed. The images were evaluated with
computer-aided detection.

[L MLO synth-2D]
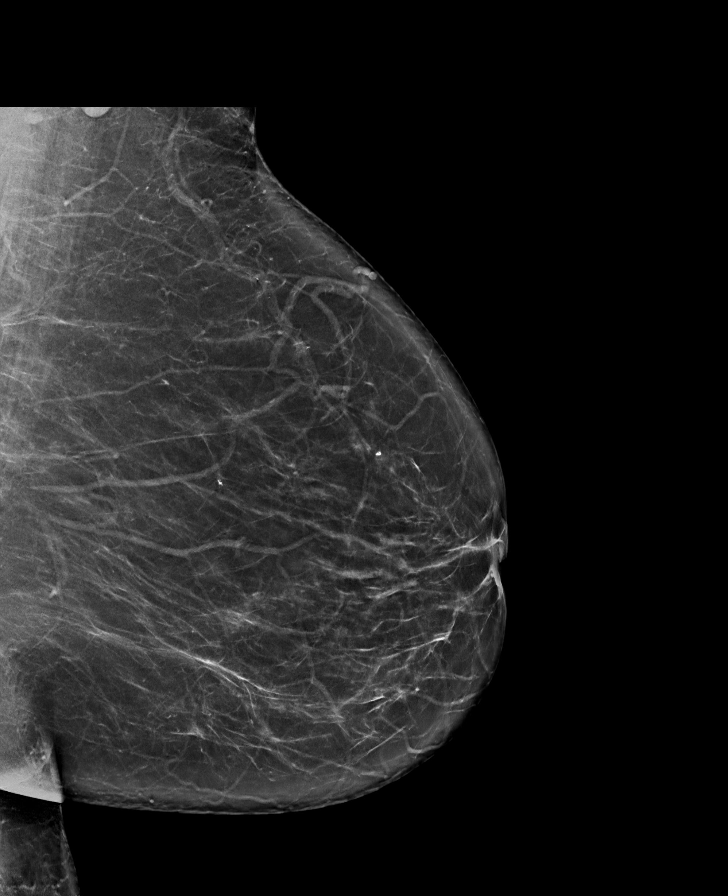

[R CC synth-2D]
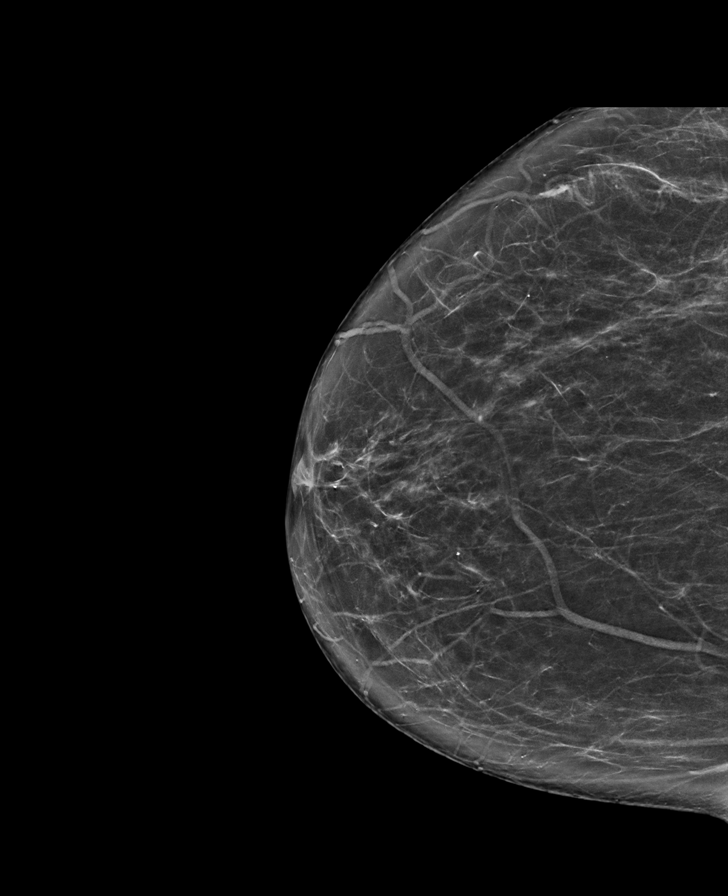

[L CC synth-2D]
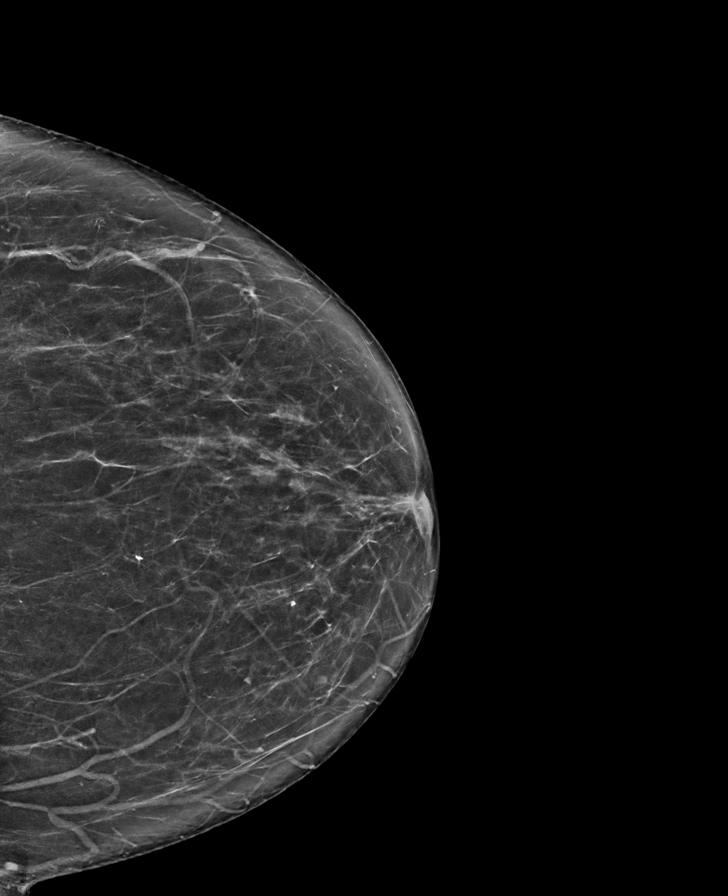

[R MLO synth-2D]
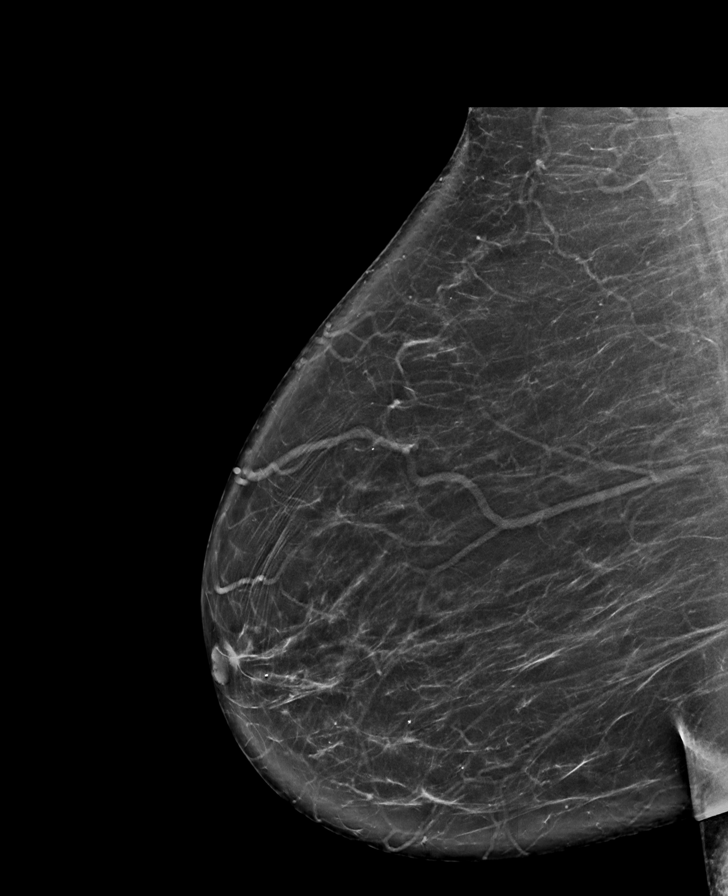

[L MLO tomo · tomo slice 43/86.0]
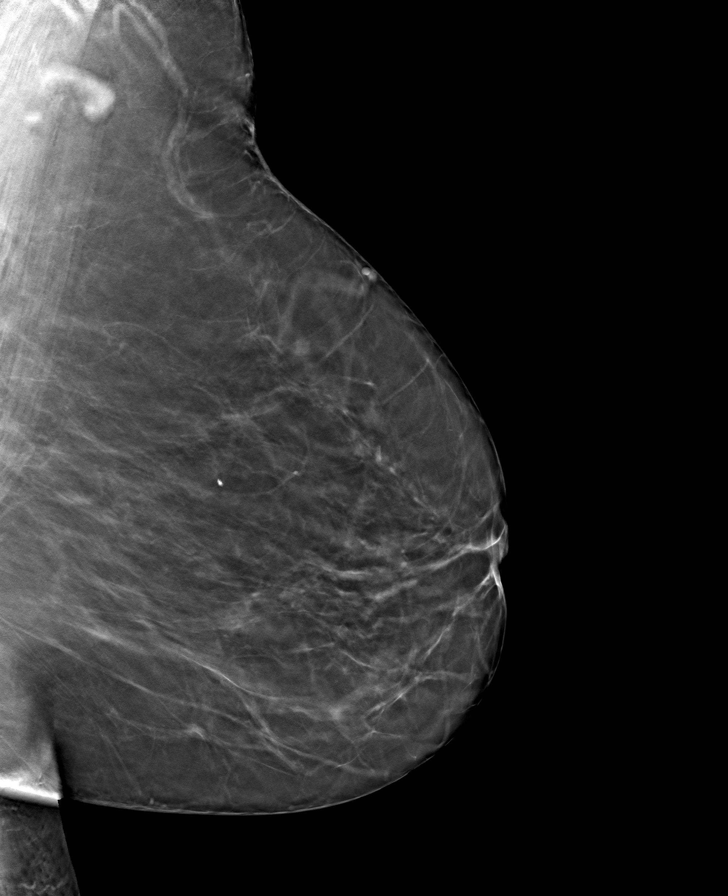

[R CC tomo · tomo slice 41/80.0]
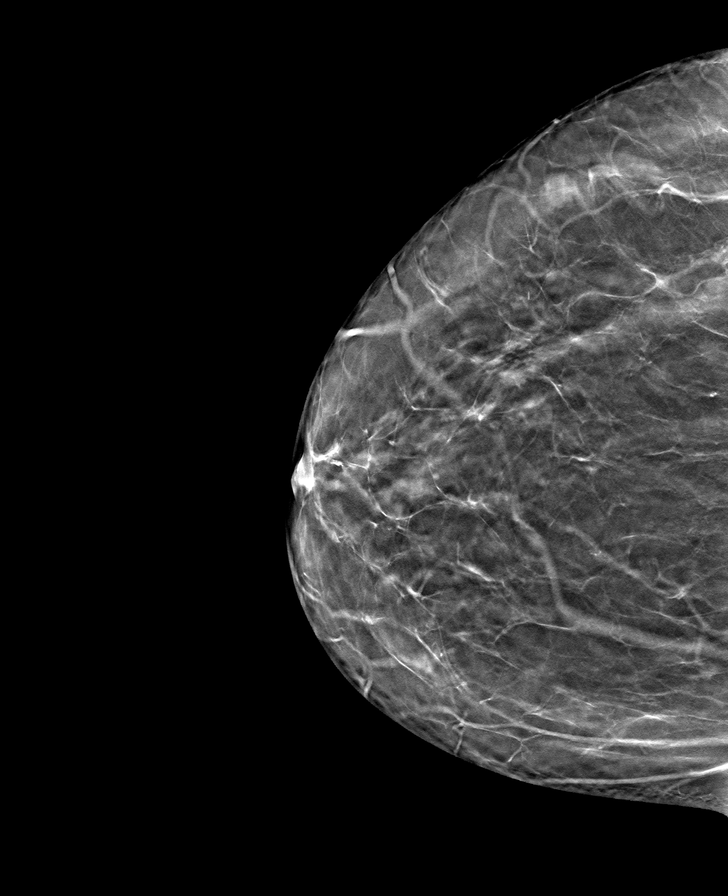

[L CC tomo · tomo slice 41/80.0]
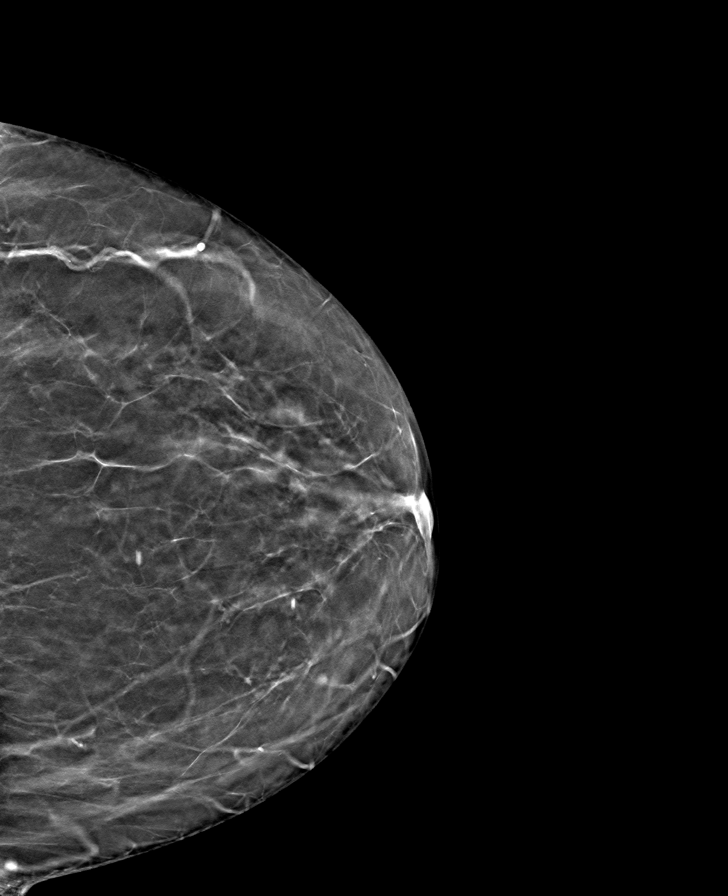

[R MLO tomo · tomo slice 45/88.0]
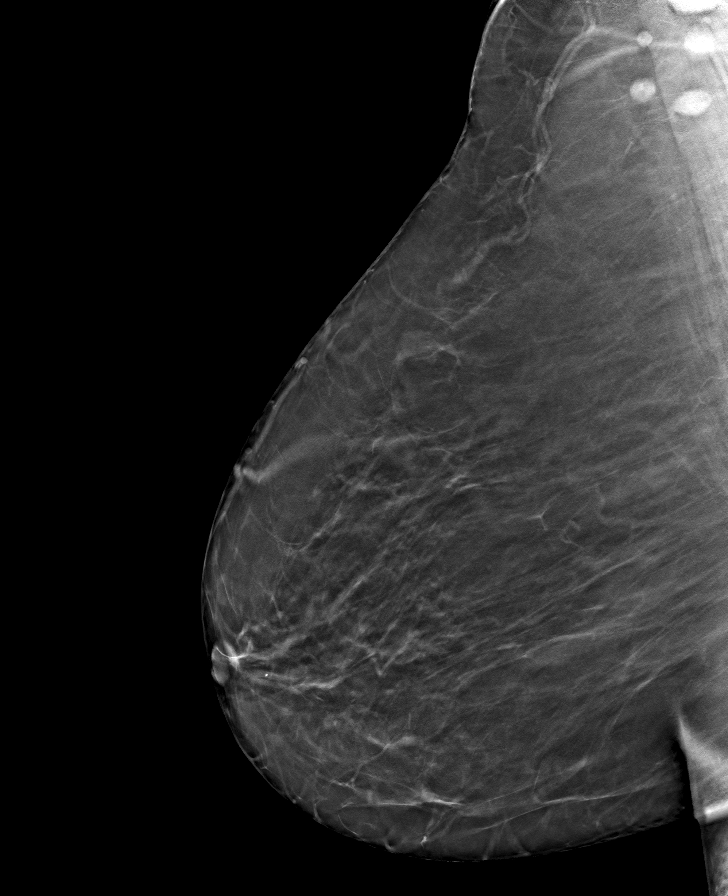

[8 of 24 positions shown; findings below may reference images not displayed]

ACR Breast Density Category b: There are scattered areas of
fibroglandular density.
FINDINGS: There are no findings suspicious for malignancy.
IMPRESSION: No mammographic evidence of malignancy. A result letter of this
screening mammogram will be mailed directly to the patient.

RECOMMENDATION:
Screening mammogram in one year. (Code:51-O-LD2)

BI-RADS CATEGORY  1: Negative.

## 2024-02-17 ENCOUNTER — Other Ambulatory Visit: Payer: Self-pay | Admitting: Nurse Practitioner

## 2024-02-17 DIAGNOSIS — K760 Fatty (change of) liver, not elsewhere classified: Secondary | ICD-10-CM

## 2024-02-17 DIAGNOSIS — K7400 Hepatic fibrosis, unspecified: Secondary | ICD-10-CM

## 2024-02-29 ENCOUNTER — Other Ambulatory Visit: Payer: 59

## 2024-03-02 ENCOUNTER — Ambulatory Visit
Admission: RE | Admit: 2024-03-02 | Discharge: 2024-03-02 | Disposition: A | Discharge status: Home / Self Care | Source: Ambulatory Visit | Attending: Nurse Practitioner

## 2024-03-02 DIAGNOSIS — K7400 Hepatic fibrosis, unspecified: Secondary | ICD-10-CM

## 2024-03-02 DIAGNOSIS — K760 Fatty (change of) liver, not elsewhere classified: Secondary | ICD-10-CM

## 2024-04-25 ENCOUNTER — Other Ambulatory Visit: Payer: Self-pay | Admitting: Nurse Practitioner

## 2024-04-25 DIAGNOSIS — Z1231 Encounter for screening mammogram for malignant neoplasm of breast: Secondary | ICD-10-CM

## 2024-05-31 ENCOUNTER — Ambulatory Visit

## 2024-06-02 ENCOUNTER — Ambulatory Visit
Admission: RE | Admit: 2024-06-02 | Discharge: 2024-06-02 | Disposition: A | Discharge status: Home / Self Care | Source: Ambulatory Visit | Attending: Nurse Practitioner | Admitting: Nurse Practitioner

## 2024-06-02 DIAGNOSIS — Z1231 Encounter for screening mammogram for malignant neoplasm of breast: Secondary | ICD-10-CM

## 2024-07-18 ENCOUNTER — Other Ambulatory Visit (INDEPENDENT_AMBULATORY_CARE_PROVIDER_SITE_OTHER)

## 2024-07-18 ENCOUNTER — Ambulatory Visit (INDEPENDENT_AMBULATORY_CARE_PROVIDER_SITE_OTHER): Admitting: Orthopaedic Surgery

## 2024-07-18 DIAGNOSIS — M25511 Pain in right shoulder: Secondary | ICD-10-CM | POA: Diagnosis not present

## 2024-07-18 DIAGNOSIS — G8929 Other chronic pain: Secondary | ICD-10-CM

## 2024-07-18 MED ORDER — METHYLPREDNISOLONE ACETATE 40 MG/ML IJ SUSP
40.0000 mg | INTRAMUSCULAR | Status: AC | PRN
Start: 2024-07-18 — End: 2024-07-18
  Administered 2024-07-18: 40 mg via INTRA_ARTICULAR

## 2024-07-18 MED ORDER — LIDOCAINE HCL 1 % IJ SOLN
3.0000 mL | INTRAMUSCULAR | Status: AC | PRN
Start: 2024-07-18 — End: 2024-07-18
  Administered 2024-07-18: 3 mL

## 2024-07-18 NOTE — Progress Notes (Signed)
 The patient is a 68 year old female that I am seeing for the first time.  She comes in with chronic right shoulder issues.  She states she had a injection of the right shoulder back in September 2021.  Recently she has developed some pain in that shoulder due to the weather changes she states.  She denies any specific injury but does report weakness in the shoulder and she cannot lift much weight loss with that right shoulder and cannot reach behind her well with her right shoulder.  She was going to have foot surgery at some point but she has some cardiac issues that kept her from being able to have surgery.  She is not a diabetic.  I did review all of her medications and past medical history within epic.  Examination of her right shoulder shows she is using her deltoid more so to abduct her right shoulder but her left shoulder moves smoothly.  Her internal rotation with adduction is limited and she can only reach to the pelvic region behind her and maybe the lower lumbar spine but her left side she can reach much higher.  She has weakness with abduction and external rotation.  3 views of the right shoulder show the humeral head is high riding suggesting rotator cuff arthropathy.  The glenohumeral joint is well-maintained but there is noted cortical irregularities around the footprint of the rotator cuff and the undersurface of the acromion.  She likely has a component of rotator cuff arthropathy.  She requested a steroid injection in the right shoulder today and I did place an injection in the subacromial outlet. she tolerated well.  My next step would have her considering a steroid injection under ultrasound by Dr. Burnetta in the glenohumeral joint area if this does not help.  All question concerns were answered and addressed.  Follow-up is otherwise as needed.    Procedure Note  Patient: Mackenzie Dudley             Date of Birth: Mar 19, 1956           MRN: [id number]             Visit Date:  07/18/2024  Procedures: Visit Diagnoses:  1. Chronic right shoulder pain     Large Joint Inj: R subacromial bursa on 07/18/2024 3:29 PM Indications: pain and diagnostic evaluation Details: 22 G 1.5 in needle  Arthrogram: No  Medications: 3 mL lidocaine  1 %; 40 mg methylPREDNISolone  acetate 40 MG/ML Outcome: tolerated well, no immediate complications Procedure, treatment alternatives, risks and benefits explained, specific risks discussed. Consent was given by the patient. Immediately prior to procedure a time out was called to verify the correct patient, procedure, equipment, support staff and site/side marked as required. Patient was prepped and draped in the usual sterile fashion.

## 2024-08-18 ENCOUNTER — Other Ambulatory Visit: Payer: Self-pay | Admitting: Podiatry

## 2024-08-18 ENCOUNTER — Other Ambulatory Visit: Payer: Self-pay | Admitting: Nurse Practitioner

## 2024-08-18 DIAGNOSIS — K7469 Other cirrhosis of liver: Secondary | ICD-10-CM

## 2024-08-18 DIAGNOSIS — K760 Fatty (change of) liver, not elsewhere classified: Secondary | ICD-10-CM

## 2024-08-18 DIAGNOSIS — K7400 Hepatic fibrosis, unspecified: Secondary | ICD-10-CM

## 2024-09-12 ENCOUNTER — Ambulatory Visit
Admission: RE | Admit: 2024-09-12 | Discharge: 2024-09-12 | Disposition: A | Discharge status: Home / Self Care | Source: Ambulatory Visit | Attending: Nurse Practitioner

## 2024-09-12 DIAGNOSIS — K760 Fatty (change of) liver, not elsewhere classified: Secondary | ICD-10-CM

## 2024-09-12 DIAGNOSIS — K7400 Hepatic fibrosis, unspecified: Secondary | ICD-10-CM

## 2024-09-12 DIAGNOSIS — K7469 Other cirrhosis of liver: Secondary | ICD-10-CM

## 2024-10-24 ENCOUNTER — Encounter: Payer: Self-pay | Admitting: Radiology

## 2024-11-12 ENCOUNTER — Emergency Department (HOSPITAL_COMMUNITY)

## 2024-11-12 ENCOUNTER — Encounter (HOSPITAL_COMMUNITY): Payer: Self-pay

## 2024-11-12 ENCOUNTER — Emergency Department (HOSPITAL_COMMUNITY)
Admission: EM | Admit: 2024-11-12 | Discharge: 2024-11-13 | Disposition: A | Discharge status: Home / Self Care | Attending: Emergency Medicine | Admitting: Emergency Medicine

## 2024-11-12 ENCOUNTER — Other Ambulatory Visit: Payer: Self-pay

## 2024-11-12 DIAGNOSIS — Y908 Blood alcohol level of 240 mg/100 ml or more: Secondary | ICD-10-CM | POA: Insufficient documentation

## 2024-11-12 DIAGNOSIS — I4891 Unspecified atrial fibrillation: Secondary | ICD-10-CM | POA: Insufficient documentation

## 2024-11-12 DIAGNOSIS — F10129 Alcohol abuse with intoxication, unspecified: Secondary | ICD-10-CM | POA: Diagnosis not present

## 2024-11-12 DIAGNOSIS — R7401 Elevation of levels of liver transaminase levels: Secondary | ICD-10-CM | POA: Insufficient documentation

## 2024-11-12 DIAGNOSIS — S0183XA Puncture wound without foreign body of other part of head, initial encounter: Secondary | ICD-10-CM | POA: Insufficient documentation

## 2024-11-12 DIAGNOSIS — F10920 Alcohol use, unspecified with intoxication, uncomplicated: Secondary | ICD-10-CM

## 2024-11-12 DIAGNOSIS — S0990XA Unspecified injury of head, initial encounter: Secondary | ICD-10-CM | POA: Diagnosis present

## 2024-11-12 DIAGNOSIS — W19XXXA Unspecified fall, initial encounter: Secondary | ICD-10-CM | POA: Diagnosis not present

## 2024-11-12 LAB — CBC WITH DIFFERENTIAL/PLATELET
Abs Immature Granulocytes: 0.01 K/uL (ref 0.00–0.07)
Basophils Absolute: 0.1 K/uL (ref 0.0–0.1)
Basophils Relative: 1 %
Eosinophils Absolute: 0.3 K/uL (ref 0.0–0.5)
Eosinophils Relative: 5 %
HCT: 36.5 % (ref 36.0–46.0)
Hemoglobin: 12.4 g/dL (ref 12.0–15.0)
Immature Granulocytes: 0 %
Lymphocytes Relative: 61 %
Lymphs Abs: 3.5 K/uL (ref 0.7–4.0)
MCH: 34.1 pg — ABNORMAL HIGH (ref 26.0–34.0)
MCHC: 34 g/dL (ref 30.0–36.0)
MCV: 100.3 fL — ABNORMAL HIGH (ref 80.0–100.0)
Monocytes Absolute: 0.6 K/uL (ref 0.1–1.0)
Monocytes Relative: 10 %
Neutro Abs: 1.3 K/uL — ABNORMAL LOW (ref 1.7–7.7)
Neutrophils Relative %: 23 %
Platelets: 201 K/uL (ref 150–400)
RBC: 3.64 MIL/uL — ABNORMAL LOW (ref 3.87–5.11)
RDW: 12 % (ref 11.5–15.5)
WBC: 5.7 K/uL (ref 4.0–10.5)
nRBC: 0 % (ref 0.0–0.2)

## 2024-11-12 LAB — COMPREHENSIVE METABOLIC PANEL WITH GFR
ALT: 36 U/L (ref 0–44)
AST: 66 U/L — ABNORMAL HIGH (ref 15–41)
Albumin: 3.8 g/dL (ref 3.5–5.0)
Alkaline Phosphatase: 49 U/L (ref 38–126)
Anion gap: 15 (ref 5–15)
BUN: 7 mg/dL — ABNORMAL LOW (ref 8–23)
CO2: 17 mmol/L — ABNORMAL LOW (ref 22–32)
Calcium: 8.7 mg/dL — ABNORMAL LOW (ref 8.9–10.3)
Chloride: 104 mmol/L (ref 98–111)
Creatinine, Ser: 0.65 mg/dL (ref 0.44–1.00)
GFR, Estimated: >60 mL/min (ref >60)
Glucose, Bld: 97 mg/dL (ref 70–99)
Potassium: 3.4 mmol/L — ABNORMAL LOW (ref 3.5–5.1)
Sodium: 136 mmol/L (ref 135–145)
Total Bilirubin: 0.5 mg/dL (ref 0.0–1.2)
Total Protein: 6.6 g/dL (ref 6.5–8.1)

## 2024-11-12 LAB — ETHANOL: Alcohol, Ethyl (B): 308 mg/dL (ref <15)

## 2024-11-12 NOTE — ED Notes (Signed)
 Cspine cleared, C-Collar removed from patient. Remains fully oriented, no neuro deficits.

## 2024-11-12 NOTE — ED Notes (Signed)
 Patient transported to CT

## 2024-11-12 NOTE — ED Triage Notes (Signed)
 At comedy show when patient was walking and had reported syncopal episode. ETOH on board. Takes thinner for afib. Patient alert upon arrival. Lac noted to right temple.

## 2024-11-12 NOTE — ED Provider Notes (Incomplete)
 [place] EMERGENCY DEPARTMENT AT [facility] Provider Note   CSN: [id number] Arrival date & time: 11/12/24  2233     Patient presents with: Mackenzie Dudley   FRANKI ALCAIDE is a 68 y.o. female.  {Add pertinent medical, surgical, social history, OB history to YEP:67052} The history is provided by the patient and medical records.  Fall   68 year old female with history of hyperlipidemia, hearing loss, chronic sleep disturbance, presenting to the ED after a fall.  Patient was at a comedy show with her daughter, does admit to some alcoholic beverages.  She was walking and reportedly had a syncopal event.  Glasses jammed into her forehead.  She did have head injury without LOC.  Has some abrasions to the forehead but denies pain.  Family unsure what thinner she takes, has hx of AFIB.  Prior to Admission medications   Medication Sig Start Date End Date Taking? Authorizing Provider  ibuprofen  (ADVIL ) 800 MG tablet Take 1 tablet (800 mg total) by mouth every 6 (six) hours as needed. 12/07/23   Tobie Franky SQUIBB, DPM  oxyCODONE -acetaminophen  (PERCOCET) 5-325 MG tablet Take 1 tablet by mouth every 4 (four) hours as needed for severe pain (pain score 7-10). 12/07/23   Tobie Franky SQUIBB, DPM  diphenhydrAMINE (BENADRYL) 25 MG tablet Take 25 mg by mouth every 6 (six) hours as needed.    [provider]  hydrocortisone cream 1 % Apply 1 application. topically 2 (two) times daily.    [provider]  ibuprofen  (ADVIL ) 800 MG tablet Take 800 mg by mouth 3 (three) times daily as needed. 05/18/22   [provider]  ibuprofen  (ADVIL ) 800 MG tablet Take 1 tablet (800 mg total) by mouth every 6 (six) hours as needed. 12/08/23   Tobie Franky SQUIBB, DPM  methocarbamol (ROBAXIN) 500 MG tablet Take 500 mg by mouth 3 (three) times daily.    [provider]  naproxen sodium (ANAPROX) 220 MG tablet Take 220 mg by mouth 2 (two) times daily with a meal.    [provider]   oxybutynin  (DITROPAN ) 5 MG tablet TAKE 1 TABLET(5 MG) BY MOUTH TWICE DAILY 10/15/22   Abonza, Maritza, PA-C  oxyCODONE -acetaminophen  (PERCOCET) 5-325 MG tablet Take 1 tablet by mouth every 4 (four) hours as needed for severe pain (pain score 7-10). 12/08/23   Tobie Franky SQUIBB, DPM  Propylene Glycol (SYSTANE BALANCE) 0.6 % SOLN Apply to eye.    [provider]  rosuvastatin  (CRESTOR ) 40 MG tablet Take 1 tablet (40 mg total) by mouth daily. 07/09/22 07/04/23  Camara, Amadou, MD  Ruxolitinib Phosphate (OPZELURA) 1.5 % CREA Apply topically. Apply to affected area    [provider]  tiZANidine  (ZANAFLEX ) 4 MG tablet Take 1 tablet (4 mg total) by mouth every 8 (eight) hours as needed for muscle spasms. 08/01/21   Vernetta Lonni GRADE, MD    Allergies: Atorvastatin calcium  and Pravastatin sodium    Review of Systems  Constitutional:        Fall on thinners  All other systems reviewed and are negative.   Updated Vital Signs BP 138/72   Pulse 70   Temp (!) 97.2 F (36.2 C) (Oral)   Resp 17   SpO2 100%   Physical Exam Vitals and nursing note reviewed.  Constitutional:      Appearance: She is well-developed.  HENT:     Head: Normocephalic and atraumatic.     Comments: Abrasions noted to the right temple and above bilateral eyebrows  Eyes:     Conjunctiva/sclera: Conjunctivae normal.     Pupils: Pupils are equal, round, and reactive to light.  Cardiovascular:     Rate and Rhythm: Normal rate and regular rhythm.     Heart sounds: Normal heart sounds.  Pulmonary:     Effort: Pulmonary effort is normal. No respiratory distress.     Breath sounds: Normal breath sounds. No rhonchi.  Abdominal:     General: Bowel sounds are normal.     Palpations: Abdomen is soft.  Musculoskeletal:        General: Normal range of motion.     Cervical back: Normal range of motion.     Comments: Pelvis stable, non-tender, no leg shortening  Skin:    General: Skin is warm and dry.   Neurological:     Mental Status: She is alert and oriented to person, place, and time.     (all labs ordered are listed, but only abnormal results are displayed) Labs Reviewed  CBC WITH DIFFERENTIAL/PLATELET  COMPREHENSIVE METABOLIC PANEL WITH GFR  ETHANOL  URINALYSIS, W/ REFLEX TO CULTURE (INFECTION SUSPECTED)    EKG: None  Radiology: No results found.  {Document cardiac monitor, telemetry assessment procedure when appropriate:32947} Procedures   Medications Ordered in the ED - No data to display    {Click here for ABCD2, HEART and other calculators REFRESH Note before signing:1}                              Medical Decision Making Amount and/or Complexity of Data Reviewed Labs: ordered. Radiology: ordered. ECG/medicine tests: ordered.   ***  {Document critical care time when appropriate  Document review of labs and clinical decision tools ie CHADS2VASC2, etc  Document your independent review of radiology images and any outside records  Document your discussion with family members, caretakers and with consultants  Document social determinants of health affecting pt's care  Document your decision making why or why not admission, treatments were needed:32947:::1}   Final diagnoses:  None    ED Discharge Orders     None

## 2024-11-13 NOTE — Progress Notes (Signed)
   11/13/24 0000  Spiritual Encounters  Type of Visit Declined chaplain visit  OnCall Visit Yes    Chaplain was paged to trauma level II. The patient declined the chaplain's visit.  Chaplain remains available if needed.   M.Kubra Susanna Kerry Resident [phone]

## 2024-11-13 NOTE — ED Notes (Signed)
 Patient ambulated with steady gait. Family will bring home

## 2024-11-13 NOTE — Discharge Instructions (Addendum)
 Labs and scan today were reassuring aside from alcohol in your system. Make sure to rest and take it easy over the weekend. Follow-up with your doctor. Return here for new concerns.
